# Patient Record
Sex: Female | Born: 1940 | Race: Black or African American | Hispanic: No | Smoking: Never smoker
Health system: Southern US, Community
[De-identification: ages and names within clinical notes are randomized; demographics above are authoritative.]

## PROBLEM LIST (undated history)

## (undated) DIAGNOSIS — N3281 Overactive bladder: Secondary | ICD-10-CM

## (undated) DIAGNOSIS — I10 Essential (primary) hypertension: Secondary | ICD-10-CM

## (undated) DIAGNOSIS — M1990 Unspecified osteoarthritis, unspecified site: Secondary | ICD-10-CM

## (undated) HISTORY — PX: BREAST SURGERY: SHX581

---

## 1998-05-14 ENCOUNTER — Other Ambulatory Visit: Admission: RE | Admit: 1998-05-14 | Discharge: 1998-05-14 | Payer: Self-pay | Admitting: Family Medicine

## 1998-07-17 ENCOUNTER — Ambulatory Visit (HOSPITAL_COMMUNITY): Admission: RE | Admit: 1998-07-17 | Discharge: 1998-07-17 | Payer: Self-pay | Admitting: Family Medicine

## 1998-07-17 ENCOUNTER — Encounter: Payer: Self-pay | Admitting: Family Medicine

## 2004-05-20 ENCOUNTER — Encounter: Admission: RE | Admit: 2004-05-20 | Discharge: 2004-05-20 | Payer: Self-pay | Admitting: General Surgery

## 2004-06-16 ENCOUNTER — Ambulatory Visit (HOSPITAL_COMMUNITY): Admission: RE | Admit: 2004-06-16 | Discharge: 2004-06-16 | Payer: Self-pay | Admitting: General Surgery

## 2004-06-16 ENCOUNTER — Encounter (INDEPENDENT_AMBULATORY_CARE_PROVIDER_SITE_OTHER): Payer: Self-pay | Admitting: Specialist

## 2004-06-24 ENCOUNTER — Ambulatory Visit: Payer: Self-pay | Admitting: Oncology

## 2004-07-07 ENCOUNTER — Ambulatory Visit: Admission: RE | Admit: 2004-07-07 | Discharge: 2004-07-07 | Payer: Self-pay | Admitting: Radiation Oncology

## 2004-07-08 ENCOUNTER — Ambulatory Visit: Admission: RE | Admit: 2004-07-08 | Discharge: 2004-09-04 | Payer: Self-pay | Admitting: Radiation Oncology

## 2004-08-31 ENCOUNTER — Ambulatory Visit: Payer: Self-pay | Admitting: Oncology

## 2004-09-23 ENCOUNTER — Encounter: Admission: RE | Admit: 2004-09-23 | Discharge: 2004-09-23 | Payer: Self-pay | Admitting: Oncology

## 2004-09-30 ENCOUNTER — Ambulatory Visit: Admission: RE | Admit: 2004-09-30 | Discharge: 2004-09-30 | Payer: Self-pay | Admitting: Radiation Oncology

## 2004-10-30 ENCOUNTER — Ambulatory Visit: Payer: Self-pay | Admitting: Oncology

## 2005-03-04 ENCOUNTER — Ambulatory Visit: Payer: Self-pay | Admitting: Oncology

## 2005-08-16 ENCOUNTER — Emergency Department (HOSPITAL_COMMUNITY): Admission: EM | Admit: 2005-08-16 | Discharge: 2005-08-16 | Payer: Self-pay | Admitting: Family Medicine

## 2005-08-26 ENCOUNTER — Ambulatory Visit: Payer: Self-pay | Admitting: Oncology

## 2006-03-02 ENCOUNTER — Ambulatory Visit: Payer: Self-pay | Admitting: Oncology

## 2006-03-11 LAB — CBC WITH DIFFERENTIAL/PLATELET
Eosinophils Absolute: 0.1 10*3/uL (ref 0.0–0.5)
HCT: 39.5 % (ref 34.8–46.6)
LYMPH%: 26.3 % (ref 14.0–48.0)
MCV: 82 fL (ref 81.0–101.0)
MONO#: 0.5 10*3/uL (ref 0.1–0.9)
MONO%: 11.9 % (ref 0.0–13.0)
NEUT#: 2.3 10*3/uL (ref 1.5–6.5)
NEUT%: 57.7 % (ref 39.6–76.8)
Platelets: 360 10*3/uL (ref 145–400)
WBC: 4 10*3/uL (ref 3.9–10.0)

## 2006-03-11 LAB — COMPREHENSIVE METABOLIC PANEL
BUN: 11 mg/dL (ref 6–23)
CO2: 30 mEq/L (ref 19–32)
Calcium: 9.4 mg/dL (ref 8.4–10.5)
Chloride: 104 mEq/L (ref 96–112)
Creatinine, Ser: 0.72 mg/dL (ref 0.40–1.20)
Glucose, Bld: 97 mg/dL (ref 70–99)
Total Bilirubin: 0.3 mg/dL (ref 0.3–1.2)

## 2006-09-06 ENCOUNTER — Ambulatory Visit: Payer: Self-pay | Admitting: Oncology

## 2006-09-09 LAB — CBC WITH DIFFERENTIAL/PLATELET
BASO%: 0.5 % (ref 0.0–2.0)
Basophils Absolute: 0 10*3/uL (ref 0.0–0.1)
Eosinophils Absolute: 0.1 10*3/uL (ref 0.0–0.5)
HCT: 40.1 % (ref 34.8–46.6)
HGB: 13.9 g/dL (ref 11.6–15.9)
MCHC: 34.7 g/dL (ref 32.0–36.0)
MONO#: 0.4 10*3/uL (ref 0.1–0.9)
NEUT#: 1.8 10*3/uL (ref 1.5–6.5)
NEUT%: 52.3 % (ref 39.6–76.8)
WBC: 3.4 10*3/uL — ABNORMAL LOW (ref 3.9–10.0)
lymph#: 1.1 10*3/uL (ref 0.9–3.3)

## 2006-09-09 LAB — COMPREHENSIVE METABOLIC PANEL
AST: 14 U/L (ref 0–37)
Alkaline Phosphatase: 70 U/L (ref 39–117)
BUN: 15 mg/dL (ref 6–23)
Glucose, Bld: 94 mg/dL (ref 70–99)
Potassium: 4 mEq/L (ref 3.5–5.3)
Sodium: 140 mEq/L (ref 135–145)
Total Bilirubin: 0.3 mg/dL (ref 0.3–1.2)
Total Protein: 7.6 g/dL (ref 6.0–8.3)

## 2006-09-26 ENCOUNTER — Encounter: Admission: RE | Admit: 2006-09-26 | Discharge: 2006-09-26 | Payer: Self-pay | Admitting: Oncology

## 2007-03-01 ENCOUNTER — Ambulatory Visit: Payer: Self-pay | Admitting: Oncology

## 2007-03-03 LAB — CBC WITH DIFFERENTIAL/PLATELET
Basophils Absolute: 0 10*3/uL (ref 0.0–0.1)
EOS%: 2.6 % (ref 0.0–7.0)
Eosinophils Absolute: 0.1 10*3/uL (ref 0.0–0.5)
HGB: 13.1 g/dL (ref 11.6–15.9)
LYMPH%: 37.8 % (ref 14.0–48.0)
MCH: 28.9 pg (ref 26.0–34.0)
MCV: 81.7 fL (ref 81.0–101.0)
MONO%: 11.1 % (ref 0.0–13.0)
NEUT#: 1.5 10*3/uL (ref 1.5–6.5)
Platelets: 305 10*3/uL (ref 145–400)

## 2007-03-03 LAB — COMPREHENSIVE METABOLIC PANEL
AST: 13 U/L (ref 0–37)
Alkaline Phosphatase: 65 U/L (ref 39–117)
BUN: 15 mg/dL (ref 6–23)
Creatinine, Ser: 0.72 mg/dL (ref 0.40–1.20)
Glucose, Bld: 111 mg/dL — ABNORMAL HIGH (ref 70–99)
Total Bilirubin: 0.3 mg/dL (ref 0.3–1.2)

## 2007-03-03 LAB — CANCER ANTIGEN 27.29: CA 27.29: 40 U/mL — ABNORMAL HIGH (ref 0–39)

## 2007-04-20 ENCOUNTER — Ambulatory Visit (HOSPITAL_COMMUNITY): Admission: RE | Admit: 2007-04-20 | Discharge: 2007-04-20 | Payer: Self-pay | Admitting: Oncology

## 2007-08-15 ENCOUNTER — Ambulatory Visit (HOSPITAL_COMMUNITY): Admission: RE | Admit: 2007-08-15 | Discharge: 2007-08-15 | Payer: Self-pay | Admitting: Family Medicine

## 2007-08-29 ENCOUNTER — Ambulatory Visit: Payer: Self-pay | Admitting: Oncology

## 2007-08-31 LAB — CBC WITH DIFFERENTIAL/PLATELET
BASO%: 0.7 % (ref 0.0–2.0)
HCT: 37.9 % (ref 34.8–46.6)
LYMPH%: 32.6 % (ref 14.0–48.0)
MCH: 28.3 pg (ref 26.0–34.0)
MCHC: 34.6 g/dL (ref 32.0–36.0)
MCV: 81.9 fL (ref 81.0–101.0)
MONO#: 0.4 10*3/uL (ref 0.1–0.9)
MONO%: 11.2 % (ref 0.0–13.0)
NEUT%: 51.9 % (ref 39.6–76.8)
Platelets: 344 10*3/uL (ref 145–400)
RBC: 4.63 10*6/uL (ref 3.70–5.32)

## 2007-08-31 LAB — COMPREHENSIVE METABOLIC PANEL
ALT: 10 U/L (ref 0–35)
Alkaline Phosphatase: 64 U/L (ref 39–117)
CO2: 25 mEq/L (ref 19–32)
Creatinine, Ser: 0.8 mg/dL (ref 0.40–1.20)
Glucose, Bld: 101 mg/dL — ABNORMAL HIGH (ref 70–99)
Sodium: 142 mEq/L (ref 135–145)
Total Bilirubin: 0.2 mg/dL — ABNORMAL LOW (ref 0.3–1.2)
Total Protein: 7.3 g/dL (ref 6.0–8.3)

## 2008-03-01 ENCOUNTER — Ambulatory Visit: Payer: Self-pay | Admitting: Oncology

## 2008-03-14 LAB — CBC WITH DIFFERENTIAL/PLATELET
BASO%: 1.7 % (ref 0.0–2.0)
HCT: 41.3 % (ref 34.8–46.6)
MCHC: 33.6 g/dL (ref 32.0–36.0)
MONO#: 0.3 10*3/uL (ref 0.1–0.9)
NEUT%: 43.1 % (ref 39.6–76.8)
WBC: 3.2 10*3/uL — ABNORMAL LOW (ref 3.9–10.0)
lymph#: 1.4 10*3/uL (ref 0.9–3.3)

## 2008-03-15 LAB — COMPREHENSIVE METABOLIC PANEL
ALT: 11 U/L (ref 0–35)
Albumin: 4.1 g/dL (ref 3.5–5.2)
CO2: 25 mEq/L (ref 19–32)
Calcium: 9.4 mg/dL (ref 8.4–10.5)
Chloride: 104 mEq/L (ref 96–112)
Creatinine, Ser: 1.15 mg/dL (ref 0.40–1.20)
Potassium: 4.4 mEq/L (ref 3.5–5.3)
Sodium: 142 mEq/L (ref 135–145)
Total Protein: 7.9 g/dL (ref 6.0–8.3)

## 2008-03-15 LAB — CANCER ANTIGEN 27.29: CA 27.29: 46 U/mL — ABNORMAL HIGH (ref 0–39)

## 2008-09-20 ENCOUNTER — Ambulatory Visit: Payer: Self-pay | Admitting: Oncology

## 2008-09-27 ENCOUNTER — Encounter: Admission: RE | Admit: 2008-09-27 | Discharge: 2008-09-27 | Payer: Self-pay | Admitting: Oncology

## 2008-10-01 LAB — CBC WITH DIFFERENTIAL/PLATELET
BASO%: 0.3 % (ref 0.0–2.0)
EOS%: 2.7 % (ref 0.0–7.0)
HCT: 40.8 % (ref 34.8–46.6)
LYMPH%: 43 % (ref 14.0–49.7)
MCH: 27.3 pg (ref 25.1–34.0)
MCHC: 33.6 g/dL (ref 31.5–36.0)
MCV: 81.4 fL (ref 79.5–101.0)
MONO%: 9.9 % (ref 0.0–14.0)
NEUT%: 44.1 % (ref 38.4–76.8)
Platelets: 304 10*3/uL (ref 145–400)

## 2008-10-02 LAB — COMPREHENSIVE METABOLIC PANEL
AST: 14 U/L (ref 0–37)
Alkaline Phosphatase: 72 U/L (ref 39–117)
BUN: 12 mg/dL (ref 6–23)
Calcium: 9.3 mg/dL (ref 8.4–10.5)
Chloride: 106 mEq/L (ref 96–112)
Creatinine, Ser: 0.83 mg/dL (ref 0.40–1.20)

## 2008-10-02 LAB — VITAMIN D 25 HYDROXY (VIT D DEFICIENCY, FRACTURES): Vit D, 25-Hydroxy: 28 ng/mL — ABNORMAL LOW (ref 30–89)

## 2008-11-01 ENCOUNTER — Ambulatory Visit (HOSPITAL_COMMUNITY): Admission: RE | Admit: 2008-11-01 | Discharge: 2008-11-01 | Payer: Self-pay | Admitting: Family Medicine

## 2009-03-31 ENCOUNTER — Ambulatory Visit: Payer: Self-pay | Admitting: Oncology

## 2009-04-02 LAB — COMPREHENSIVE METABOLIC PANEL
AST: 18 U/L (ref 0–37)
Albumin: 3.5 g/dL (ref 3.5–5.2)
Alkaline Phosphatase: 66 U/L (ref 39–117)
Potassium: 3.7 mEq/L (ref 3.5–5.3)
Sodium: 140 mEq/L (ref 135–145)
Total Protein: 7.5 g/dL (ref 6.0–8.3)

## 2009-04-02 LAB — CBC WITH DIFFERENTIAL/PLATELET
BASO%: 0.8 % (ref 0.0–2.0)
Basophils Absolute: 0 10*3/uL (ref 0.0–0.1)
EOS%: 2.9 % (ref 0.0–7.0)
HGB: 13.8 g/dL (ref 11.6–15.9)
MCH: 27.8 pg (ref 25.1–34.0)
MONO#: 0.4 10*3/uL (ref 0.1–0.9)
RDW: 13.9 % (ref 11.2–14.5)
WBC: 3.8 10*3/uL — ABNORMAL LOW (ref 3.9–10.3)
lymph#: 1.7 10*3/uL (ref 0.9–3.3)

## 2009-08-22 ENCOUNTER — Ambulatory Visit: Payer: Self-pay | Admitting: Oncology

## 2009-08-26 LAB — COMPREHENSIVE METABOLIC PANEL
ALT: 12 U/L (ref 0–35)
AST: 18 U/L (ref 0–37)
Alkaline Phosphatase: 66 U/L (ref 39–117)
BUN: 12 mg/dL (ref 6–23)
Chloride: 105 mEq/L (ref 96–112)
Creatinine, Ser: 0.77 mg/dL (ref 0.40–1.20)

## 2009-08-26 LAB — CBC WITH DIFFERENTIAL/PLATELET
BASO%: 0.7 % (ref 0.0–2.0)
Basophils Absolute: 0 10*3/uL (ref 0.0–0.1)
EOS%: 3.1 % (ref 0.0–7.0)
HGB: 13.9 g/dL (ref 11.6–15.9)
MCH: 27.8 pg (ref 25.1–34.0)
MCHC: 33.3 g/dL (ref 31.5–36.0)
MCV: 83.6 fL (ref 79.5–101.0)
MONO%: 10.5 % (ref 0.0–14.0)
NEUT%: 49.8 % (ref 38.4–76.8)
RDW: 13.3 % (ref 11.2–14.5)
lymph#: 1.5 10*3/uL (ref 0.9–3.3)

## 2009-08-26 LAB — VITAMIN D 25 HYDROXY (VIT D DEFICIENCY, FRACTURES): Vit D, 25-Hydroxy: 32 ng/mL (ref 30–89)

## 2010-08-27 ENCOUNTER — Other Ambulatory Visit: Payer: Self-pay | Admitting: Oncology

## 2010-08-27 ENCOUNTER — Encounter (HOSPITAL_BASED_OUTPATIENT_CLINIC_OR_DEPARTMENT_OTHER): Payer: Medicare Other | Admitting: Oncology

## 2010-08-27 DIAGNOSIS — D0590 Unspecified type of carcinoma in situ of unspecified breast: Secondary | ICD-10-CM

## 2010-08-27 LAB — CBC WITH DIFFERENTIAL/PLATELET
BASO%: 0.8 % (ref 0.0–2.0)
Basophils Absolute: 0 10*3/uL (ref 0.0–0.1)
Eosinophils Absolute: 0.2 10*3/uL (ref 0.0–0.5)
HCT: 39.6 % (ref 34.8–46.6)
HGB: 13.1 g/dL (ref 11.6–15.9)
LYMPH%: 33.2 % (ref 14.0–49.7)
MONO#: 0.5 10*3/uL (ref 0.1–0.9)
NEUT#: 1.8 10*3/uL (ref 1.5–6.5)
NEUT%: 49 % (ref 38.4–76.8)
Platelets: 352 10*3/uL (ref 145–400)
WBC: 3.8 10*3/uL — ABNORMAL LOW (ref 3.9–10.3)
lymph#: 1.3 10*3/uL (ref 0.9–3.3)

## 2010-08-28 LAB — COMPREHENSIVE METABOLIC PANEL
ALT: 10 U/L (ref 0–35)
BUN: 15 mg/dL (ref 6–23)
CO2: 23 mEq/L (ref 19–32)
Calcium: 8.9 mg/dL (ref 8.4–10.5)
Chloride: 105 mEq/L (ref 96–112)
Creatinine, Ser: 0.84 mg/dL (ref 0.40–1.20)
Glucose, Bld: 121 mg/dL — ABNORMAL HIGH (ref 70–99)

## 2010-08-28 LAB — VITAMIN D 25 HYDROXY (VIT D DEFICIENCY, FRACTURES): Vit D, 25-Hydroxy: 35 ng/mL (ref 30–89)

## 2010-09-03 ENCOUNTER — Encounter: Payer: Medicare Other | Admitting: Oncology

## 2010-10-30 NOTE — Op Note (Signed)
NAMEJAKEIRA, SEEMAN          ACCOUNT NO.:  [id number]   MEDICAL RECORD NO.:  [id number]          PATIENT TYPE:  OIB   LOCATION:  2899                         FACILITY:  [facility]   PHYSICIAN:  Rose Phi. Maple Hudson, M.D.   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   OPERATION:  Right partial mastectomy with needle localization and specimen  mammogram.   SURGEON:  Rose Phi. Maple Hudson, M.D.   ANESTHESIA:  General.   OPERATIVE PROCEDURE:  Prior to coming to the operating room, localizing  wires, three, had been placed in the right breast to localize the area of  DCIS visible on the mammogram.   After suitable general anesthesia was induced, the patient was placed in the  supine position with the arms extended on the arm board and the right breast  prepped and draped in the usual fashion.   Using the wires as a guide, a curved incision in the upper outer quadrant  was then made.  I exposed all the wires into the incision and then did a  wide excision of the wires and surrounding tissue and then oriented the  specimen for the pathologist.   Specimen mammogram confirmed the removal of the calcifications, and it was  thought to be close at the deep margin but we were on the pectoralis fascia  at that level.   With good hemostasis, I infiltrated all the incisions with 0.25% Marcaine.  We then closed in two layers of 3-0 Vicryl and subcuticular 4-0 Monocryl and  Steri-Strips.  Dressing applied.  Patient transferred to the recovery room  in satisfactory condition, having tolerated the procedure well.      Pete   PRY/MEDQ  D:  06/16/2004  T:  06/16/2004  Job:  [id number]

## 2013-10-26 ENCOUNTER — Telehealth: Payer: Self-pay | Admitting: Oncology

## 2013-10-26 NOTE — Telephone Encounter (Signed)
10/26/13 - Called patient on the phone for yearly follow-up to the NSABP B-35 study.  She states that she is doing good.  No problems.   She has not been in the hospital in the last year.  She has not had any fractures or broken bones and the only medicine she takes is calcium. She has not had a bone density test and has not had her mammogram this year.  I stressed to her to schedule a apt for her mammogram to be done. She sees Dr. Ronne BinningMcKenzie for her primary care needs.

## 2014-11-05 ENCOUNTER — Telehealth: Payer: Self-pay | Admitting: Oncology

## 2014-11-05 NOTE — Telephone Encounter (Signed)
11/05/14 - Spoke to patient on phone.  She states she is doing good.  No problems.  She has not been in the hospital, no broken bones or fractures. She received the letter with the info on which medicine she received while on the study.  I thanked the patient for her support in this clinical trial. Jasmine Vaughan 11/05/14 - 1:30 pm

## 2015-03-26 ENCOUNTER — Encounter (HOSPITAL_COMMUNITY): Payer: Self-pay | Admitting: Emergency Medicine

## 2015-03-26 ENCOUNTER — Emergency Department (INDEPENDENT_AMBULATORY_CARE_PROVIDER_SITE_OTHER)
Admission: EM | Admit: 2015-03-26 | Discharge: 2015-03-26 | Disposition: A | Discharge status: Home / Self Care | Payer: Medicare Other | Source: Home / Self Care | Attending: Family Medicine | Admitting: Family Medicine

## 2015-03-26 DIAGNOSIS — S46911A Strain of unspecified muscle, fascia and tendon at shoulder and upper arm level, right arm, initial encounter: Secondary | ICD-10-CM

## 2015-03-26 MED ORDER — MELOXICAM 15 MG PO TABS: 7.5000 mg | ORAL_TABLET | Freq: Every day | ORAL | Status: DC

## 2015-03-26 MED ORDER — METHOCARBAMOL 500 MG PO TABS: 500.0000 mg | ORAL_TABLET | Freq: Four times a day (QID) | ORAL | Status: DC | PRN

## 2015-03-26 NOTE — ED Provider Notes (Signed)
CSN: [id number]     Arrival date & time 03/26/15  1429 History   First MD Initiated Contact with Patient 03/26/15 1440     Chief Complaint  Patient presents with  . Shoulder Pain   (Consider location/radiation/quality/duration/timing/severity/associated sxs/prior Treatment) HPI  Right shoulder pain. Started 2 weeks ago. Gradual onset. No change in exercise, or trauma to the shoulder. Constant with waxing and waning nature. Hydrocodone from time to time without improvement. Biofreeze without improvement. No NSAID use. Radiation of pain from right shoulder to the proximal arm. Denies any loss of function or sensation in the arm.  History reviewed. No pertinent past medical history. Past Surgical History  Procedure Laterality Date  . Breast surgery     Family History  Problem Relation Age of Onset  . Family history unknown: Yes   Social History  Substance Use Topics  . Smoking status: Never Smoker   . Smokeless tobacco: None  . Alcohol Use: No   OB History    No data available     Review of Systems   Per HPI with all other pertinent systems negative.   Allergies  Review of patient's allergies indicates no known allergies.  Home Medications   Prior to Admission medications   Medication Sig Start Date End Date Taking? Authorizing Provider  acetaminophen (TYLENOL) 325 MG tablet Take 650 mg by mouth every 6 (six) hours as needed.   Yes Historical Provider, MD  HYDROcodone-acetaminophen (NORCO/VICODIN) 5-325 MG tablet Take 1 tablet by mouth every 6 (six) hours as needed for moderate pain.   Yes Historical Provider, MD  Menthol, Topical Analgesic, (BENGAY EX) Apply topically.   Yes Historical Provider, MD  Menthol, Topical Analgesic, (BIOFREEZE EX) Apply topically.   Yes Historical Provider, MD  meloxicam (MOBIC) 15 MG tablet Take 0.5-1 tablets (7.5-15 mg total) by mouth daily. 03/26/15   Ozella Rocksavid J Basilio Meadow, MD  methocarbamol (ROBAXIN) 500 MG tablet Take 1-2 tablets (500-1,000 mg  total) by mouth every 6 (six) hours as needed for muscle spasms. 03/26/15   Ozella Rocksavid J Miyako Oelke, MD   Meds Ordered and Administered this Visit  Medications - No data to display  BP 184/89 mmHg  Pulse 83  Temp(Src) 98.6 F (37 C) (Oral)  Resp 16  SpO2 100% No data found.   Physical Exam Physical Exam  Constitutional: oriented to person, place, and time. appears well-developed and well-nourished. No distress.  HENT:  Head: Normocephalic and atraumatic.  Eyes: EOMI. PERRL.  Neck: Normal range of motion.  Cardiovascular: RRR, no m/r/g, 2+ distal pulses,  Pulmonary/Chest: Effort normal and breath sounds normal. No respiratory distress.  Abdominal: Soft. Bowel sounds are normal. NonTTP, no distension.  Musculoskeletal: Right arm with limited range of motion with flexion greater than 90 and abduction greater than 45. Nontender to palpation. No effusions. Patient did not tolerate Hawkins, or cross body.  Neurological: alert and oriented to person, place, and time.  Skin: Skin is warm. No rash noted. non diaphoretic.  Psychiatric: normal mood and affect. behavior is normal. Judgment and thought content normal. ]  ED Course  Procedures (including critical care time)  Labs Review Labs Reviewed - No data to display  Imaging Review No results found.   Visual Acuity Review  Right Eye Distance:   Left Eye Distance:   Bilateral Distance:    Right Eye Near:   Left Eye Near:    Bilateral Near:         MDM   1. Shoulder strain, right, initial  encounter    Lengthy discussion had with regards to rehabilitation exercises and patient will start these. Patient will start meloxicam and Robaxin. Patient follow-up with primary care physician or sports medicine for further treatments such as physical therapy or possible steroid injection.    Ozella Rocks, MD 03/26/15 [phone]

## 2015-03-26 NOTE — ED Notes (Signed)
Right shoulder pain, unknown injury.  Shoulder pain for 3 weeks.  Radial pulse 2 plus.  Shoulder pain constant and increases with movement.  Reports unable to elevate arm above shoulder level.  denies fall.  Patient is right handed

## 2015-03-26 NOTE — Discharge Instructions (Signed)
You have developed shoulder strain causing irritation to the muscles and nerves of the shoulder and upper arm. Please start the exercises as discussed (15 repetitions each side (L&R), repeated 2-3 times per session for 2-3 sessions daily daily). Please start the meloxicam for pain and inflammation. This may upset your stomach to make sure that you take it with food and consider taking an antacid. These use the Robaxin for additional muscle strain relief. This medicine may make you sleepy. If your symptoms do not improve you may need a steroid injection into the shoulder or formal physical therapy. Physical therapy will have to be ordered by her primary care doctor or by a sports medicine specialist.

## 2015-04-07 ENCOUNTER — Other Ambulatory Visit: Payer: Self-pay | Admitting: Internal Medicine

## 2017-07-31 ENCOUNTER — Other Ambulatory Visit: Payer: Self-pay

## 2017-07-31 ENCOUNTER — Ambulatory Visit (HOSPITAL_COMMUNITY)
Admission: EM | Admit: 2017-07-31 | Discharge: 2017-07-31 | Disposition: A | Discharge status: Home / Self Care | Payer: Medicare Other | Attending: Physician Assistant | Admitting: Physician Assistant

## 2017-07-31 ENCOUNTER — Encounter (HOSPITAL_COMMUNITY): Payer: Self-pay | Admitting: *Deleted

## 2017-07-31 DIAGNOSIS — R030 Elevated blood-pressure reading, without diagnosis of hypertension: Secondary | ICD-10-CM

## 2017-07-31 DIAGNOSIS — R21 Rash and other nonspecific skin eruption: Secondary | ICD-10-CM

## 2017-07-31 MED ORDER — TRIAMCINOLONE ACETONIDE 0.1 % EX CREA: 1.0000 "application " | TOPICAL_CREAM | Freq: Two times a day (BID) | CUTANEOUS | 0 refills | Status: DC

## 2017-07-31 NOTE — ED Triage Notes (Signed)
States saw a "beige looking spider" when shaking out her blanket 1.5 wks ago; 2 days ago noticed itching to spot on left back.  Lesion noted with small surrounding area of bumpy rash.  C/O HA and "pain in sides when walking".

## 2017-07-31 NOTE — ED Provider Notes (Signed)
07/31/2017 12:57 PM   DOB: 02-12-1941 / MRN: [id number]  SUBJECTIVE:  Jasmine Vaughan is a 77 y.o. female presenting for rash in the posterior left upper thorax that started about a week ago and she complains mostly of itching.  She thinks this was a spider bite.  She has 2 other  areas that have been itching her on the left side about the lateral chest and also has rash on her right hip.  She has tried several creams.  She does not have a history of hypertension.  She has a primary provider  She has No Known Allergies.   She  has no past medical history on file.    She  reports that  has never smoked. she has never used smokeless tobacco. She reports that she does not drink alcohol or use drugs. She  has no sexual activity history on file. The patient  has a past surgical history that includes Breast surgery.  Her Family history is unknown by patient.  Review of Systems  Constitutional: Negative for fever.  Respiratory: Negative for shortness of breath.   Cardiovascular: Negative for chest pain and leg swelling.  Gastrointestinal: Negative for nausea.  Skin: Positive for itching and rash.  Neurological: Negative for dizziness.    OBJECTIVE:  BP (!) 166/92   Pulse 95   Temp 99.5 F (37.5 C) (Oral)   Resp 18   SpO2 96%   BP Readings from Last 3 Encounters:  07/31/17 (!) 166/92  03/26/15 184/89   Physical Exam  Constitutional: She is active.  Non-toxic appearance.  Cardiovascular: Normal rate, regular rhythm, S1 normal, S2 normal, normal heart sounds and intact distal pulses. Exam reveals no gallop, no friction rub and no decreased pulses.  No murmur heard. Pulmonary/Chest: Effort normal. No stridor. No tachypnea. No respiratory distress. She has no wheezes. She has no rales.  Abdominal: She exhibits no distension.  Musculoskeletal: She exhibits no edema.  Neurological: She is alert.  Skin: Skin is warm and dry. Rash noted. She is not diaphoretic. No pallor.       No  results found for this or any previous visit (from the past 72 hour(s)).  No results found.  ASSESSMENT AND PLAN:  No orders of the defined types were placed in this encounter.    Rash and nonspecific skin eruption - If the rash were not bilateral I would have diagnosed shingles.  However this would not change much as the rash has been present now for about a week.  Given the incessant itching it may be bug bites.  She denies any new residence in her home.  We will try some triamcinolone and she will see her primary care provider if the problem persists.  Elevated blood pressure reading - 148/72 by auscultation on recheck.      The patient is advised to call or return to clinic if she does not see an improvement in symptoms, or to seek the care of the closest emergency department if she worsens with the above plan.   Deliah BostonMichael Clark, MHS, PA-C 07/31/2017 12:57 PM    Ofilia Neaslark, Michael L, PA-C 07/31/17 1257

## 2017-07-31 NOTE — Discharge Instructions (Signed)
See your primary care provider in the next week or 2 if the problem persist.  I sent some cream to your pharmacy that should help with itching and make the rash decrease.

## 2017-08-19 DIAGNOSIS — I517 Cardiomegaly: Secondary | ICD-10-CM | POA: Diagnosis not present

## 2017-08-19 DIAGNOSIS — I10 Essential (primary) hypertension: Secondary | ICD-10-CM | POA: Diagnosis not present

## 2017-08-19 DIAGNOSIS — R9431 Abnormal electrocardiogram [ECG] [EKG]: Secondary | ICD-10-CM | POA: Diagnosis not present

## 2017-09-06 DIAGNOSIS — I10 Essential (primary) hypertension: Secondary | ICD-10-CM | POA: Diagnosis not present

## 2017-09-06 DIAGNOSIS — E785 Hyperlipidemia, unspecified: Secondary | ICD-10-CM | POA: Diagnosis not present

## 2017-09-06 DIAGNOSIS — I119 Hypertensive heart disease without heart failure: Secondary | ICD-10-CM | POA: Diagnosis not present

## 2017-09-06 DIAGNOSIS — R7303 Prediabetes: Secondary | ICD-10-CM | POA: Diagnosis not present

## 2017-10-03 DIAGNOSIS — Z1211 Encounter for screening for malignant neoplasm of colon: Secondary | ICD-10-CM | POA: Diagnosis not present

## 2017-10-03 DIAGNOSIS — E782 Mixed hyperlipidemia: Secondary | ICD-10-CM | POA: Diagnosis not present

## 2017-10-03 DIAGNOSIS — I10 Essential (primary) hypertension: Secondary | ICD-10-CM | POA: Diagnosis not present

## 2017-10-18 DIAGNOSIS — K5730 Diverticulosis of large intestine without perforation or abscess without bleeding: Secondary | ICD-10-CM | POA: Diagnosis not present

## 2017-10-18 DIAGNOSIS — Z1211 Encounter for screening for malignant neoplasm of colon: Secondary | ICD-10-CM | POA: Diagnosis not present

## 2017-11-15 DIAGNOSIS — I119 Hypertensive heart disease without heart failure: Secondary | ICD-10-CM | POA: Diagnosis not present

## 2017-11-15 DIAGNOSIS — E785 Hyperlipidemia, unspecified: Secondary | ICD-10-CM | POA: Diagnosis not present

## 2017-11-15 DIAGNOSIS — I10 Essential (primary) hypertension: Secondary | ICD-10-CM | POA: Diagnosis not present

## 2017-11-15 DIAGNOSIS — R51 Headache: Secondary | ICD-10-CM | POA: Diagnosis not present

## 2017-11-15 DIAGNOSIS — Z0000 Encounter for general adult medical examination without abnormal findings: Secondary | ICD-10-CM | POA: Diagnosis not present

## 2017-11-15 DIAGNOSIS — R7303 Prediabetes: Secondary | ICD-10-CM | POA: Diagnosis not present

## 2017-11-21 DIAGNOSIS — I10 Essential (primary) hypertension: Secondary | ICD-10-CM | POA: Diagnosis not present

## 2017-11-21 DIAGNOSIS — E785 Hyperlipidemia, unspecified: Secondary | ICD-10-CM | POA: Diagnosis not present

## 2017-11-21 DIAGNOSIS — M542 Cervicalgia: Secondary | ICD-10-CM | POA: Diagnosis not present

## 2017-12-01 DIAGNOSIS — R7303 Prediabetes: Secondary | ICD-10-CM | POA: Diagnosis not present

## 2017-12-01 DIAGNOSIS — I119 Hypertensive heart disease without heart failure: Secondary | ICD-10-CM | POA: Diagnosis not present

## 2017-12-01 DIAGNOSIS — M542 Cervicalgia: Secondary | ICD-10-CM | POA: Diagnosis not present

## 2017-12-01 DIAGNOSIS — E785 Hyperlipidemia, unspecified: Secondary | ICD-10-CM | POA: Diagnosis not present

## 2017-12-01 DIAGNOSIS — I10 Essential (primary) hypertension: Secondary | ICD-10-CM | POA: Diagnosis not present

## 2017-12-12 DIAGNOSIS — I10 Essential (primary) hypertension: Secondary | ICD-10-CM | POA: Diagnosis not present

## 2017-12-12 DIAGNOSIS — M542 Cervicalgia: Secondary | ICD-10-CM | POA: Diagnosis not present

## 2017-12-12 DIAGNOSIS — E785 Hyperlipidemia, unspecified: Secondary | ICD-10-CM | POA: Diagnosis not present

## 2017-12-12 DIAGNOSIS — R7303 Prediabetes: Secondary | ICD-10-CM | POA: Diagnosis not present

## 2017-12-12 DIAGNOSIS — I119 Hypertensive heart disease without heart failure: Secondary | ICD-10-CM | POA: Diagnosis not present

## 2017-12-13 DIAGNOSIS — M5412 Radiculopathy, cervical region: Secondary | ICD-10-CM | POA: Diagnosis not present

## 2017-12-22 DIAGNOSIS — M5412 Radiculopathy, cervical region: Secondary | ICD-10-CM | POA: Diagnosis not present

## 2018-01-09 ENCOUNTER — Other Ambulatory Visit: Payer: Self-pay

## 2018-01-09 ENCOUNTER — Ambulatory Visit: Payer: Medicare Other | Attending: Orthopedic Surgery | Admitting: Physical Therapy

## 2018-01-09 ENCOUNTER — Encounter: Payer: Self-pay | Admitting: Physical Therapy

## 2018-01-09 DIAGNOSIS — M62838 Other muscle spasm: Secondary | ICD-10-CM | POA: Diagnosis not present

## 2018-01-09 DIAGNOSIS — M542 Cervicalgia: Secondary | ICD-10-CM | POA: Insufficient documentation

## 2018-01-09 NOTE — Therapy (Signed)
[facility] [address] [place], [place], [address] Phone: [phone]   Fax:  [phone]  Physical Therapy Evaluation  Patient Details  Name: Jasmine Vaughan MRN: [id number] Date of Birth: 05-Aug-1940 Referring Provider: Dr Lunette Stands    Encounter Date: 01/09/2018  PT End of Session - 01/09/18 1323    Visit Number  1    Number of Visits  12    Date for PT Re-Evaluation  02/20/18    PT Start Time  1148    PT Stop Time  1229    PT Time Calculation (min)  41 min    Activity Tolerance  Patient tolerated treatment well    Behavior During Therapy  [facility] for tasks assessed/performed       History reviewed. No pertinent past medical history.  Past Surgical History:  Procedure Laterality Date  . BREAST SURGERY      There were no vitals filed for this visit.   Subjective Assessment - 01/09/18 1151    Subjective  Patient began having neck and shoulder blade pain backj in March. In February she was bittent but a spider on the left side of the back. she feels like that pain went to the right shoulder.     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  Yes    Pain Score  5     Pain Location  Neck    Pain Orientation  Right    Pain Descriptors / Indicators  Aching    Pain Type  Chronic pain    Pain Radiating Towards  has had some pain down into the hand but feels it mayhave came from hitting her elbow     Pain Onset  More than a month ago    Aggravating Factors   turning her head     Pain Relieving Factors  massage     Effect of Pain on Daily Activities  difficulty turning her head to complete daily tasks          [facility] PT Assessment - 01/09/18 0001      Assessment   Medical Diagnosis  Right Cervical Spine Pain     Referring Provider  Dr Lunette Stands     Hand Dominance  Right    Next MD Visit  Thursday     Prior  Therapy  None       Precautions   Precautions  None      Restrictions   Weight Bearing Restrictions  No      Coordination   Gross Motor Movements are Fluid and Coordinated  Yes    Fine Motor Movements are Fluid and Coordinated  Yes      Posture/Postural Control   Posture Comments  rounded shoulders       ROM / Strength   AROM / PROM / Strength  PROM;AROM;Strength      AROM   Overall AROM Comments  full active flexion of the shoulder but pain at end range     AROM Assessment Site  Cervical    Cervical Flexion  20    Cervical Extension  14    Cervical - Right Side Bend  pain     Cervical - Left Side Bend  no pain     Cervical - Right Rotation  45    Cervical -  Left Rotation  65      Strength   Strength Assessment Site  Shoulder    Right/Left Shoulder  Right;Left    Right Shoulder Flexion  5/5    Right Shoulder ABduction  4+/5    Right Shoulder Internal Rotation  5/5    Right Shoulder External Rotation  4+/5    Left Shoulder Flexion  4/5    Left Shoulder Internal Rotation  4+/5    Left Shoulder External Rotation  4/5      Palpation   Palpation comment  PSamingin into left cervical paraspinals and left upper trap       Special Tests   Other special tests  Cervicalgia.                 Objective measurements completed on examination: See above findings.      [facility] Adult PT Treatment/Exercise - 01/09/18 0001      Manual Therapy   Manual Therapy  Soft tissue mobilization;Manual Traction    Soft tissue mobilization  to bilateral upper traps     Manual Traction  gentle to cervical spine; sub occipital release       Neck Exercises: Stretches   Upper Trapezius Stretch  3 reps;20 seconds;Right    Levator Stretch  30 seconds;2 reps;Right             PT Education - 01/09/18 1320    Education Details  reviewed HEP and symptom management     Person(s) Educated  Patient    Methods  Explanation;Demonstration;Tactile cues;Handout;Verbal cues     Comprehension  Verbalized understanding;Returned demonstration;Verbal cues required;Tactile cues required       PT Short Term Goals - 01/09/18 1508      PT SHORT TERM GOAL #1   Title  Patient will increase neck extension by 10 degrees    Time  3    Period  Weeks    Status  New    Target Date  02/06/18      PT SHORT TERM GOAL #2   Title  Patient will increase right cervical rotation by 20 degrees     Time  3    Period  Weeks    Status  New    Target Date  01/30/18      PT SHORT TERM GOAL #3   Title  {Patient will increase right shoulder flexion to 4+/5     Time  3    Period  Weeks    Target Date  01/30/18        PT Long Term Goals - 01/09/18 1311      PT LONG TERM GOAL #1   Title  Patient will demosntrate normal pain free neck motion into right rotation and cervical extension    Time  4    Period  Weeks    Status  New    Target Date  02/06/18      PT LONG TERM GOAL #2   Title  Patient will demsotrate a 40% limiation on FOTO to demonstrate improved function     Time  4    Period  Weeks    Status  New    Target Date  02/06/18             Plan - 01/09/18 1323    Clinical Impression Statement  Patient is a 77 year old female who presents with right sided cervical pain. She presents with significant spasming in his right upper trap and right cervical  spine. She has decreased right upper extremity strength. She has limited right cervical flexion and limited cervical extension. 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 She would benefit from skilled therapy to decrease pain and spasming and to increase movement of the cervical spine     History and Personal Factors relevant to plan of care:  nothing     Clinical Presentation  Evolving    Clinical Decision Making  Low    Rehab Potential  Good    PT Frequency  2x / week    PT Duration  6 weeks    PT Treatment/Interventions  ADLs/Self Care Home Management;Cryotherapy;Electrical  Stimulation;Iontophoresis 4mg /ml Dexamethasone;Traction;Moist Heat;Ultrasound;DME Instruction;Therapeutic activities;Therapeutic exercise;Neuromuscular re-education;Patient/family education;Manual techniques;Passive range of motion;Splinting;Taping;Dry needling    PT Next Visit Plan  continue soft tisue mobilization, manual traction, add scap reatraction; chin tucks; consder supineER and abduction; modalities PRN. [facility] but is uneasy about trying [facility].     PT Home Exercise Plan  upper trap stretch, levator stretch; self soft tissue mobilization     Consulted and Agree with Plan of Care  Patient       Patient will benefit from skilled therapeutic intervention in order to improve the following deficits and impairments:  Pain, Increased fascial restricitons, Impaired tone, Increased muscle spasms, Decreased activity tolerance, Decreased endurance, Decreased strength, Decreased range of motion  Visit Diagnosis: Cervicalgia - Plan: PT plan of care cert/re-cert  Other muscle spasm - Plan: PT plan of care cert/re-cert     Problem List There are no active problems to display for this patient.   Dessie Comaavid J Kataleena Holsapple PT DPT  01/09/2018, 3:17 PM  [facility] [address] [place], [place], [address] Phone: [phone]   Fax:  [phone]  Name: Jasmine Vaughan MRN: [id number] Date of Birth: 02-Jun-1941

## 2018-01-11 ENCOUNTER — Encounter: Payer: Self-pay | Admitting: Physical Therapy

## 2018-01-11 ENCOUNTER — Ambulatory Visit: Payer: Medicare Other | Admitting: Physical Therapy

## 2018-01-11 DIAGNOSIS — M542 Cervicalgia: Secondary | ICD-10-CM | POA: Diagnosis not present

## 2018-01-11 DIAGNOSIS — M62838 Other muscle spasm: Secondary | ICD-10-CM

## 2018-01-11 NOTE — Patient Instructions (Signed)
Head Press With Chin Tuck    Tuck chin SLIGHTLY toward chest, keep mouth closed. Feel weight on back of head. Increase weight by pressing head down. Hold __5_ seconds. Relax. Repeat _10-20__ times. Surface: floor or bed   Scapular Retraction (Standing)   With arms at sides, pinch shoulder blades together. Repeat _10___ times per set. Do _2___ sets per session. Do _2___ sessions per day.

## 2018-01-11 NOTE — Therapy (Signed)
[facility] [address] [place], [place], [address] Phone: [phone]   Fax:  [phone]  Physical Therapy Treatment  Patient Details  Name: Jasmine Vaughan MRN: [id number] Date of Birth: [birth year] Referring Provider: Dr Lunette Stands    Encounter Date: 01/11/2018  PT End of Session - 01/11/18 1039    Visit Number  2    Number of Visits  12    Date for PT Re-Evaluation  02/20/18    PT Start Time  1015    PT Stop Time  1103    PT Time Calculation (min)  48 min       History reviewed. No pertinent past medical history.  Past Surgical History:  Procedure Laterality Date  . BREAST SURGERY      There were no vitals filed for this visit.  Subjective Assessment - 01/11/18 1021    Subjective  Early this morning I had pain in my right upper trap and side of neck.    Currently in Pain?  No/denies                       [facility] Adult PT Treatment/Exercise - 01/11/18 0001      Neck Exercises: Seated   Other Seated Exercise  scap retract seated and standing       Neck Exercises: Supine   Neck Retraction  10 reps      Modalities   Modalities  Moist Heat      Moist Heat Therapy   Number Minutes Moist Heat  10 Minutes    Moist Heat Location  Cervical      Manual Therapy   Soft tissue mobilization  to right upper trap , paraspinals     Manual Traction  gentle to cervical spine; sub occipital release       Neck Exercises: Stretches   Upper Trapezius Stretch  3 reps;20 seconds;Right    Levator Stretch  30 seconds;2 reps;Right             PT Education - 01/11/18 1056    Education Details  HEP    Person(s) Educated  Patient    Methods  Explanation;Handout    Comprehension  Verbalized understanding       PT Short Term Goals - 01/09/18 1508      PT SHORT TERM GOAL #1   Title  Patient will increase neck extension by 10 degrees    Time  3    Period  Weeks    Status  New    Target Date   02/06/18      PT SHORT TERM GOAL #2   Title  Patient will increase right cervical rotation by 20 degrees     Time  3    Period  Weeks    Status  New    Target Date  01/30/18      PT SHORT TERM GOAL #3   Title  {Patient will increase right shoulder flexion to 4+/5     Time  3    Period  Weeks    Target Date  01/30/18        PT Long Term Goals - 01/09/18 1311      PT LONG TERM GOAL #1   Title  Patient will demosntrate normal pain free neck motion into right rotation and cervical extension    Time  4    Period  Weeks    Status  New    Target Date  02/06/18      PT LONG TERM GOAL #2   Title  Patient will demsotrate a 40% limiation on FOTO to demonstrate improved function     Time  4    Period  Weeks    Status  New    Target Date  02/06/18            Plan - 01/11/18 1057    Clinical Impression Statement  Reviewed neck stretches and progressed with postural correction exercises. No pain at beginning of session. Some pain with levator stretch. Time focused on manual to right neck musculature. HMP at end of session. She reports decreased pain with cervical distraction.     PT Next Visit Plan  continue soft tisue mobilization, manual traction, add scap reatraction; chin tucks; consder supineER and abduction; modalities PRN. [facility] but is uneasy about trying it.     PT Home Exercise Plan  upper trap stretch, levator stretch; self soft tissue mobilization , scap squeeze, supine chin tuck    Consulted and Agree with Plan of Care  Patient       Patient will benefit from skilled therapeutic intervention in order to improve the following deficits and impairments:  Pain, Increased fascial restricitons, Impaired tone, Increased muscle spasms, Decreased activity tolerance, Decreased endurance, Decreased strength, Decreased range of motion  Visit Diagnosis: Cervicalgia  Other muscle spasm     Problem List There are no active problems to display for this  patient.   Sherrie MustacheDonoho, Hermelinda Diegel McGee, VirginiaPTA 01/11/2018, 11:44 AM  [facility] [address] [place], [place], [address] Phone: [phone]   Fax:  [phone]  Name: Jasmine DieterBarbara Calk MRN: [id number] Date of Birth: [birth year]

## 2018-01-12 DIAGNOSIS — M5412 Radiculopathy, cervical region: Secondary | ICD-10-CM | POA: Diagnosis not present

## 2018-01-23 DIAGNOSIS — M542 Cervicalgia: Secondary | ICD-10-CM | POA: Diagnosis not present

## 2018-01-23 DIAGNOSIS — Z5181 Encounter for therapeutic drug level monitoring: Secondary | ICD-10-CM | POA: Diagnosis not present

## 2018-01-23 DIAGNOSIS — I119 Hypertensive heart disease without heart failure: Secondary | ICD-10-CM | POA: Diagnosis not present

## 2018-01-23 DIAGNOSIS — I10 Essential (primary) hypertension: Secondary | ICD-10-CM | POA: Diagnosis not present

## 2018-01-24 ENCOUNTER — Ambulatory Visit: Payer: Medicare Other | Attending: Orthopedic Surgery | Admitting: Physical Therapy

## 2018-01-24 ENCOUNTER — Encounter: Payer: Self-pay | Admitting: Physical Therapy

## 2018-01-24 DIAGNOSIS — M62838 Other muscle spasm: Secondary | ICD-10-CM | POA: Diagnosis not present

## 2018-01-24 DIAGNOSIS — M542 Cervicalgia: Secondary | ICD-10-CM | POA: Insufficient documentation

## 2018-01-24 NOTE — Patient Instructions (Signed)
Over Head Pull: Narrow Grip       On back, knees bent, feet flat, band across thighs, elbows straight but relaxed. Pull hands apart (start). Keeping elbows straight, bring arms up and over head, hands toward floor. Keep pull steady on band. Hold momentarily. Return slowly, keeping pull steady, back to start. Repeat __5-10_ times. Band color __Y____   Side Pull: Double Arm   On back, knees bent, feet flat. Arms perpendicular to body, shoulder level, elbows straight but relaxed. Pull arms out to sides, elbows straight. Resistance band comes across collarbones, hands toward floor. Hold momentarily. Slowly return to starting position. Repeat _5-10__ times. Band color _Y____   Sash   On back, knees bent, feet flat, left hand on left hip, right hand above left. Pull right arm DIAGONALLY (hip to shoulder) across chest. Bring right arm along head toward floor. Hold momentarily. Slowly return to starting position. Repeat __5-10_ times. Do with left arm. Band color __Y____   Shoulder Rotation: Double Arm   On back, knees bent, feet flat, elbows tucked at sides, bent 90, hands palms up. Pull hands apart and down toward floor, keeping elbows near sides. Hold momentarily. Slowly return to starting position. Repeat _5-10__ times. Band color __Y____

## 2018-01-24 NOTE — Therapy (Signed)
[name], [place], [address] Phone: [phone]   Fax:  [phone]  Physical Therapy Treatment  Patient Details  Name: Jasmine Vaughan MRN: [id number] Date of Birth: [birth year] Referring Provider: Dr Almedia Balls    Encounter Date: 01/24/2018  PT End of Session - 01/24/18 1031    Visit Number  3    Number of Visits  12    Date for PT Re-Evaluation  02/20/18    PT Start Time  4825    PT Stop Time  1110    PT Time Calculation (min)  55 min       History reviewed. No pertinent past medical history.  Past Surgical History:  Procedure Laterality Date  . BREAST SURGERY      There were no vitals filed for this visit.  Subjective Assessment - 01/24/18 1021    Subjective  Went to MD yesterday and she was pleased. Overall I feel 75% decrease in neck pain.     Currently in Pain?  No/denies    Aggravating Factors   reaching behind head and neck to wash     Pain Relieving Factors  rest          [facility] PT Assessment - 01/24/18 0001      AROM   Cervical Flexion  40    Cervical Extension  40    Cervical - Right Rotation  60    Cervical - Left Rotation  50      Strength   Right Shoulder Flexion  4/5                   [facility] Adult PT Treatment/Exercise - 01/24/18 0001      Neck Exercises: Supine   Neck Retraction  10 reps    Other Supine Exercise  supine scap stab yelllow 5 reps x 2 each       Moist Heat Therapy   Number Minutes Moist Heat  15 Minutes    Moist Heat Location  Cervical      Manual Therapy   Soft tissue mobilization  to right upper trap , paraspinals     Manual Traction  gentle to cervical spine; sub occipital release              PT Education - 01/24/18 1044    Education Details  HEP     Person(s) Educated  Patient    Methods  Explanation;Handout    Comprehension  Verbalized understanding       PT Short Term Goals - 01/24/18 1031      PT SHORT TERM  GOAL #1   Title  Patient will increase neck extension by 10 degrees    Time  3    Period  Weeks    Status  Achieved      PT SHORT TERM GOAL #2   Title  Patient will increase right cervical rotation by 20 degrees     Baseline  15 degree improvement     Time  3    Period  Weeks    Status  Partially Met      PT SHORT TERM GOAL #3   Title  {Patient will increase right shoulder flexion to 4+/5     Baseline  4/5 right shoulder flexion    Time  3    Period  Weeks    Status  On-going        PT Long Term Goals - 01/24/18 0037  PT LONG TERM GOAL #1   Title  Patient will demosntrate normal pain free neck motion into right rotation and cervical extension    Time  4    Period  Weeks      PT LONG TERM GOAL #2   Title  Patient will demsotrate a 40% limiation on FOTO to demonstrate improved function     Time  4    Period  Weeks    Status  Unable to assess            Plan - 01/24/18 1024    Clinical Impression Statement  No longer pain with cervical rotation. Palpation much less tender to right side of neck/ upper trap. Pt feels pain has moved to her lumbar spine. AROM improved in extension, flexion and right cervical rotation. STG# 1 met, STG# 2 partially met. No change in right shoulder flexion strength. Began scapular stabilization exercises and updated HEP. Some increased pain with multiple reps. Cautioned to stop when painful.     PT Next Visit Plan  continue soft tisue mobilization, manual traction, add scap reatraction; chin tucks; review supine scap stab; modalities PRN. Would benefit from [facility] but is uneasy about trying it.     PT Home Exercise Plan  upper trap stretch, levator stretch; self soft tissue mobilization , scap squeeze, supine chin tuck, supine scap stab yellow band     Consulted and Agree with Plan of Care  Patient       Patient will benefit from skilled therapeutic intervention in order to improve the following deficits and impairments:  Pain, Increased  fascial restricitons, Impaired tone, Increased muscle spasms, Decreased activity tolerance, Decreased endurance, Decreased strength, Decreased range of motion  Visit Diagnosis: Cervicalgia  Other muscle spasm     Problem List There are no active problems to display for this patient.   Jasmine Vaughan, [place] 01/24/2018, 11:03 AM  [facility] [address] [place], [place], [address] Phone: [phone]   Fax:  [phone]  Name: Jasmine Vaughan MRN: [id number] Date of Birth: 22-Nov-1940

## 2018-01-30 ENCOUNTER — Ambulatory Visit: Payer: Medicare Other | Admitting: Physical Therapy

## 2018-01-30 ENCOUNTER — Encounter: Payer: Self-pay | Admitting: Physical Therapy

## 2018-01-30 DIAGNOSIS — M62838 Other muscle spasm: Secondary | ICD-10-CM | POA: Diagnosis not present

## 2018-01-30 DIAGNOSIS — M542 Cervicalgia: Secondary | ICD-10-CM

## 2018-01-31 ENCOUNTER — Encounter: Payer: Self-pay | Admitting: Physical Therapy

## 2018-01-31 NOTE — Therapy (Signed)
[address] [place] [address], [place], [address] Phone: [phone]   Fax:  [phone]  Physical Therapy Treatment  Patient Details  Name: Jasmine Vaughan MRN: [id number] Date of Birth: 1940-08-26 Referring Provider: Dr Almedia Balls    Encounter Date: 01/30/2018  PT End of Session - 01/30/18 1440    Visit Number  4    Number of Visits  12    Date for PT Re-Evaluation  02/20/18    PT Start Time  0623    PT Stop Time  1456    PT Time Calculation (min)  41 min    Activity Tolerance  Patient tolerated treatment well    Behavior During Therapy  [facility] for tasks assessed/performed       History reviewed. No pertinent past medical history.  Past Surgical History:  Procedure Laterality Date  . BREAST SURGERY      There were no vitals filed for this visit.  Subjective Assessment - 01/30/18 1420    Subjective  Patient reports her pain has been better. She has still had alittle pain early in the morning. She is not having pain right now.     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  No/denies                       [facility] Adult PT Treatment/Exercise - 01/31/18 0001      Neck Exercises: Supine   Neck Retraction  10 reps    Other Supine Exercise  supine scap stab yelllow 5 reps x 2 each       Shoulder Exercises: Standing   Extension Limitations  yellow 2x7     Row Limitations  yellow 2x7       Moist Heat Therapy   Moist Heat Location  Cervical      Manual Therapy   Soft tissue mobilization  to right upper trap , paraspinals; IASTYM     Manual Traction  gentle to cervical spine; sub occipital release              PT Education - 01/30/18 1440    Education Details  reviewed HEP; importance of posture.     Person(s) Educated  Patient    Methods  Demonstration;Explanation;Tactile  cues;Verbal cues    Comprehension  Verbalized understanding;Returned demonstration;Verbal cues required;Tactile cues required;Need further instruction       PT Short Term Goals - 01/24/18 1031      PT SHORT TERM GOAL #1   Title  Patient will increase neck extension by 10 degrees    Time  3    Period  Weeks    Status  Achieved      PT SHORT TERM GOAL #2   Title  Patient will increase right cervical rotation by 20 degrees     Baseline  15 degree improvement     Time  3    Period  Weeks    Status  Partially Met      PT SHORT TERM GOAL #3   Title  {Patient will increase right shoulder flexion to 4+/5     Baseline  4/5 right shoulder flexion    Time  3    Period  Weeks    Status  On-going        PT Long Term  Goals - 01/24/18 1033      PT LONG TERM GOAL #1   Title  Patient will demosntrate normal pain free neck motion into right rotation and cervical extension    Time  4    Period  Weeks      PT LONG TERM GOAL #2   Title  Patient will demsotrate a 40% limiation on FOTO to demonstrate improved function     Time  4    Period  Weeks    Status  Unable to assess            Plan - 01/31/18 0814    Clinical Impression Statement  Patient is makinggood progress. she continues to have some pspasming on the right but it has improved signifcantly. She was given light postrual execises to work on at home. She had no incease in pain.  Per visual inpection her rotation has improved significantly.     Clinical Presentation  Evolving    Clinical Decision Making  Low    Rehab Potential  Good    PT Frequency  2x / week    PT Duration  6 weeks    PT Treatment/Interventions  ADLs/Self Care Home Management;Cryotherapy;Electrical Stimulation;Iontophoresis 4m/ml Dexamethasone;Traction;Moist Heat;Ultrasound;DME Instruction;Therapeutic activities;Therapeutic exercise;Neuromuscular re-education;Patient/family education;Manual techniques;Passive range of motion;Splinting;Taping;Dry needling     PT Next Visit Plan  continue soft tisue mobilization, manual traction, add scap reatraction; chin tucks; review supine scap stab; modalities PRN. [facility]     PT Home Exercise Plan  upper trap stretch, levator stretch; self soft tissue mobilization , scap squeeze, supine chin tuck, supine scap stab yellow band     Consulted and Agree with Plan of Care  Patient       Patient will benefit from skilled therapeutic intervention in order to improve the following deficits and impairments:  Pain, Increased fascial restricitons, Impaired tone, Increased muscle spasms, Decreased activity tolerance, Decreased endurance, Decreased strength, Decreased range of motion, Impaired sensation  Visit Diagnosis: Cervicalgia  Other muscle spasm     Problem List There are no active problems to display for this patient.   DCarney LivingPT DPT  01/31/2018, 8:17 AM  [facility]GDun[place] [address]Phone: [phone]  Fax:  [phone] Name: Jasmine LevelMRN: [id number] of Birth: 9January 12, 1942

## 2018-02-06 ENCOUNTER — Encounter: Payer: Self-pay | Admitting: Physical Therapy

## 2018-02-06 ENCOUNTER — Ambulatory Visit: Payer: Medicare Other | Admitting: Physical Therapy

## 2018-02-06 DIAGNOSIS — M62838 Other muscle spasm: Secondary | ICD-10-CM | POA: Diagnosis not present

## 2018-02-06 DIAGNOSIS — M542 Cervicalgia: Secondary | ICD-10-CM

## 2018-02-06 NOTE — Therapy (Addendum)
[place], [place], [address] Phone: [phone]   Fax:  [phone]  Physical Therapy Treatment/Discharge  Patient Details  Name: Jasmine Vaughan MRN: [id number] Date of Birth: June 27, 1940 Referring Provider: Dr Almedia Balls    Encounter Date: 02/06/2018  PT End of Session - 02/06/18 1152    Visit Number  5    Number of Visits  12    Date for PT Re-Evaluation  02/20/18    PT Start Time  6381    PT Stop Time  1225    PT Time Calculation (min)  40 min       History reviewed. No pertinent past medical history.  Past Surgical History:  Procedure Laterality Date  . BREAST SURGERY      There were no vitals filed for this visit.  Subjective Assessment - 02/06/18 1148    Subjective  About 75% improved. Doing the band exercises. I think we can make this my last day.     Currently in Pain?  No/denies         [facility] PT Assessment - 02/06/18 0001      Observation/Other Assessments   Focus on Therapeutic Outcomes (FOTO)   27% limited improved from 54% limited       AROM   Cervical Flexion  40    Cervical Extension  40    Cervical - Right Side Bend  no pain     Cervical - Left Side Bend  mild pain    Cervical - Right Rotation  60    Cervical - Left Rotation  55      Strength   Right Shoulder Flexion  4+/5                   [facility] Adult PT Treatment/Exercise - 02/06/18 0001      Neck Exercises: Supine   Neck Retraction  10 reps    Other Supine Exercise  supine scap stab yelllow 5 reps x 2 each       Shoulder Exercises: Standing   Extension Limitations  2 x 10 yellow    Row Limitations  2 x 10 yellow      Moist Heat Therapy   Number Minutes Moist Heat  10 Minutes    Moist Heat Location  Cervical      Neck Exercises: Stretches   Upper Trapezius Stretch  3 reps;20 seconds;Right    Levator Stretch  30 seconds;2 reps;Right               PT Short Term Goals - 02/06/18  1200      PT SHORT TERM GOAL #1   Title  Patient will increase neck extension by 10 degrees    Time  3    Period  Weeks    Status  Achieved      PT SHORT TERM GOAL #2   Title  Patient will increase right cervical rotation by 20 degrees     Baseline  15 degree improvement     Time  3    Period  Weeks    Status  Partially Met      PT SHORT TERM GOAL #3   Title  {Patient will increase right shoulder flexion to 4+/5     Baseline  4+/5     Time  3    Period  Weeks    Status  Achieved        PT Long Term Goals - 02/06/18  1200      PT LONG TERM GOAL #1   Title  Patient will demosntrate normal pain free neck motion into right rotation and cervical extension    Baseline  pain free, slight limitation     Time  4    Period  Weeks    Status  Partially Met      PT LONG TERM GOAL #2   Title  Patient will demsotrate a 40% limiation on FOTO to demonstrate improved function     Baseline  27% limited improved from 54% limited     Time  4    Period  Weeks    Status  Achieved            Plan - 02/06/18 1212    Clinical Impression Statement  Pt demonstrates improved cervical AROM, improved shoulder strength and decreased pain. Mild pain with left side bend with pain on left side of neck. Otherwise, she is unable to reproduce any cervical pain and reports no pain since the last two sessions. She requests discharge today due to being pleased with current level of fucntion.     PT Next Visit Plan  continue soft tisue mobilization, manual traction, add scap reatraction; chin tucks; review supine scap stab; modalities PRN. Would benefit from [facility] but is uneasy about trying it.     PT Home Exercise Plan  upper trap stretch, levator stretch; self soft tissue mobilization , scap squeeze, supine chin tuck, supine scap stab yellow band , standing row and extension    Consulted and Agree with Plan of Care  Patient       Patient will benefit from skilled therapeutic intervention in order to  improve the following deficits and impairments:  Pain, Increased fascial restricitons, Impaired tone, Increased muscle spasms, Decreased activity tolerance, Decreased endurance, Decreased strength, Decreased range of motion, Impaired sensation  Visit Diagnosis: Cervicalgia  Other muscle spasm    PHYSICAL THERAPY DISCHARGE SUMMARY  Visits from Start of Care: 5  Current functional level related to goals / functional outcomes: 75% decrease in pain with functional activity    Remaining deficits: Pain at times    Education / Equipment: HEP   Plan: Patient agrees to discharge.  Patient goals were partially met. Patient is being discharged due to not returning since the last visit.  ?????       Problem List There are no active problems to display for this patient.  Carolyne Littles PT DPT  02/06/2018  Dorene Ar, PTA 02/06/2018, 12:30 PM  [facility] [address] [place], [place], [address] Phone: [phone]   Fax:  [phone]  Name: Kanesha Cadle MRN: [id number] Date of Birth: 24-Jan-1941

## 2018-03-20 DIAGNOSIS — I10 Essential (primary) hypertension: Secondary | ICD-10-CM | POA: Diagnosis not present

## 2018-03-20 DIAGNOSIS — E785 Hyperlipidemia, unspecified: Secondary | ICD-10-CM | POA: Diagnosis not present

## 2018-03-20 DIAGNOSIS — R7303 Prediabetes: Secondary | ICD-10-CM | POA: Diagnosis not present

## 2018-03-20 DIAGNOSIS — M542 Cervicalgia: Secondary | ICD-10-CM | POA: Diagnosis not present

## 2018-03-20 DIAGNOSIS — I119 Hypertensive heart disease without heart failure: Secondary | ICD-10-CM | POA: Diagnosis not present

## 2018-04-11 ENCOUNTER — Encounter: Payer: Self-pay | Admitting: Physical Therapy

## 2018-04-17 DIAGNOSIS — Z853 Personal history of malignant neoplasm of breast: Secondary | ICD-10-CM | POA: Diagnosis not present

## 2018-04-17 DIAGNOSIS — Z1231 Encounter for screening mammogram for malignant neoplasm of breast: Secondary | ICD-10-CM | POA: Diagnosis not present

## 2018-04-17 DIAGNOSIS — M85852 Other specified disorders of bone density and structure, left thigh: Secondary | ICD-10-CM | POA: Diagnosis not present

## 2018-04-20 ENCOUNTER — Other Ambulatory Visit: Payer: Self-pay

## 2018-04-20 NOTE — Patient Outreach (Signed)
[facility]) Care Management  04/20/2018  Jasmine Vaughan [birth year] [id number]   Medication Adherence call to Jasmine Vaughan spoke with patient she still has a full bottle patient forgets to take it sometimes. she said don't order it until she is finished with what she has  patient is due on Pravastatin 10 mg. Jasmine Vaughan is showing past due under [facility].   Lillia Abed CPhT Pharmacy Technician [facility] Management Direct Dial [phone]  Fax [phone] [email or web address]

## 2018-04-24 DIAGNOSIS — I10 Essential (primary) hypertension: Secondary | ICD-10-CM | POA: Diagnosis not present

## 2018-04-24 DIAGNOSIS — R7303 Prediabetes: Secondary | ICD-10-CM | POA: Diagnosis not present

## 2018-04-24 DIAGNOSIS — E785 Hyperlipidemia, unspecified: Secondary | ICD-10-CM | POA: Diagnosis not present

## 2018-04-24 DIAGNOSIS — Z79899 Other long term (current) drug therapy: Secondary | ICD-10-CM | POA: Diagnosis not present

## 2018-05-18 DIAGNOSIS — Z0001 Encounter for general adult medical examination with abnormal findings: Secondary | ICD-10-CM | POA: Diagnosis not present

## 2018-05-23 ENCOUNTER — Emergency Department (HOSPITAL_COMMUNITY): Payer: Medicare Other

## 2018-05-23 ENCOUNTER — Encounter (HOSPITAL_COMMUNITY): Payer: Self-pay | Admitting: *Deleted

## 2018-05-23 ENCOUNTER — Emergency Department (HOSPITAL_COMMUNITY)
Admission: EM | Admit: 2018-05-23 | Discharge: 2018-05-23 | Disposition: A | Discharge status: Home / Self Care | Payer: Medicare Other | Attending: Emergency Medicine | Admitting: Emergency Medicine

## 2018-05-23 DIAGNOSIS — I10 Essential (primary) hypertension: Secondary | ICD-10-CM | POA: Diagnosis not present

## 2018-05-23 DIAGNOSIS — R1032 Left lower quadrant pain: Secondary | ICD-10-CM | POA: Diagnosis not present

## 2018-05-23 DIAGNOSIS — Y999 Unspecified external cause status: Secondary | ICD-10-CM | POA: Diagnosis not present

## 2018-05-23 DIAGNOSIS — X509XXA Other and unspecified overexertion or strenuous movements or postures, initial encounter: Secondary | ICD-10-CM | POA: Insufficient documentation

## 2018-05-23 DIAGNOSIS — Z008 Encounter for other general examination: Secondary | ICD-10-CM | POA: Diagnosis not present

## 2018-05-23 DIAGNOSIS — S39012A Strain of muscle, fascia and tendon of lower back, initial encounter: Secondary | ICD-10-CM

## 2018-05-23 DIAGNOSIS — Y939 Activity, unspecified: Secondary | ICD-10-CM | POA: Diagnosis not present

## 2018-05-23 DIAGNOSIS — Y929 Unspecified place or not applicable: Secondary | ICD-10-CM | POA: Diagnosis not present

## 2018-05-23 DIAGNOSIS — K5730 Diverticulosis of large intestine without perforation or abscess without bleeding: Secondary | ICD-10-CM | POA: Diagnosis not present

## 2018-05-23 DIAGNOSIS — R109 Unspecified abdominal pain: Secondary | ICD-10-CM

## 2018-05-23 LAB — URINALYSIS, ROUTINE W REFLEX MICROSCOPIC
BILIRUBIN URINE: NEGATIVE
Glucose, UA: NEGATIVE mg/dL
Hgb urine dipstick: NEGATIVE
KETONES UR: NEGATIVE mg/dL
Leukocytes, UA: NEGATIVE
NITRITE: NEGATIVE
Protein, ur: NEGATIVE mg/dL
SPECIFIC GRAVITY, URINE: 1.002 — AB (ref 1.005–1.030)
pH: 8 (ref 5.0–8.0)

## 2018-05-23 LAB — CBC
HCT: 46.8 % — ABNORMAL HIGH (ref 36.0–46.0)
HEMOGLOBIN: 14.3 g/dL (ref 12.0–15.0)
MCH: 26.5 pg (ref 26.0–34.0)
MCHC: 30.6 g/dL (ref 30.0–36.0)
MCV: 86.8 fL (ref 80.0–100.0)
NRBC: 0 % (ref 0.0–0.2)
Platelets: 405 10*3/uL — ABNORMAL HIGH (ref 150–400)
RBC: 5.39 MIL/uL — ABNORMAL HIGH (ref 3.87–5.11)
RDW: 13.3 % (ref 11.5–15.5)
WBC: 4.2 10*3/uL (ref 4.0–10.5)

## 2018-05-23 LAB — COMPREHENSIVE METABOLIC PANEL
ALT: 16 U/L (ref 0–44)
ANION GAP: 11 (ref 5–15)
AST: 19 U/L (ref 15–41)
Albumin: 3.8 g/dL (ref 3.5–5.0)
Alkaline Phosphatase: 58 U/L (ref 38–126)
BUN: 7 mg/dL — AB (ref 8–23)
CHLORIDE: 103 mmol/L (ref 98–111)
CO2: 27 mmol/L (ref 22–32)
Calcium: 9.3 mg/dL (ref 8.9–10.3)
Creatinine, Ser: 0.76 mg/dL (ref 0.44–1.00)
Glucose, Bld: 123 mg/dL — ABNORMAL HIGH (ref 70–99)
POTASSIUM: 3.6 mmol/L (ref 3.5–5.1)
Sodium: 141 mmol/L (ref 135–145)
Total Bilirubin: 0.6 mg/dL (ref 0.3–1.2)
Total Protein: 7.8 g/dL (ref 6.5–8.1)

## 2018-05-23 LAB — LIPASE, BLOOD: LIPASE: 47 U/L (ref 11–51)

## 2018-05-23 MED ORDER — LACTATED RINGERS IV BOLUS
1000.0000 mL | Freq: Once | INTRAVENOUS | Status: AC
  Administered 2018-05-23: 1000 mL via INTRAVENOUS

## 2018-05-23 MED ORDER — FENTANYL CITRATE (PF) 100 MCG/2ML IJ SOLN
50.0000 ug | Freq: Once | INTRAMUSCULAR | Status: AC
  Administered 2018-05-23: 50 ug via INTRAVENOUS
  Filled 2018-05-23: qty 2

## 2018-05-23 MED ORDER — IOPAMIDOL (ISOVUE-300) INJECTION 61%
100.0000 mL | Freq: Once | INTRAVENOUS | Status: AC | PRN
  Administered 2018-05-23: 100 mL via INTRAVENOUS

## 2018-05-23 NOTE — Discharge Instructions (Addendum)
Continue Tylenol and Motrin as needed.  Follow-up with primary care doctor for further work-up.  Return to ED if symptoms worsen.

## 2018-05-23 NOTE — ED Triage Notes (Signed)
Pt in c/o LLQ abdominal pain that first started in November, pain has gotten progressively worse and increased last night, also complains of nausea, denies vomiting or diarrhea

## 2018-05-23 NOTE — ED Notes (Signed)
Patient verbalizes understanding of discharge instructions. Opportunity for questioning and answers were provided. Armband removed by staff, pt discharged from ED. Pt wheeled to lobby and taken home by family 

## 2018-05-23 NOTE — ED Provider Notes (Signed)
[facility] EMERGENCY DEPARTMENT Provider Note   CSN: [id number] Arrival date & time: 05/23/18  1034     History   Chief Complaint Chief Complaint  Patient presents with  . Abdominal Pain    HPI Cecilie Heidel is a 77 y.o. female.  The history is provided by the patient.  Abdominal Pain   This is a new problem. The current episode started yesterday. The problem occurs constantly. The problem has not changed since onset.Associated with: Increased activity yesterday setting up holiday decorations.  The pain is located in the LLQ (left lower back). The quality of the pain is aching and dull. The pain is at a severity of 3/10. The pain is moderate. Pertinent negatives include anorexia, fever, belching, diarrhea, flatus, hematochezia, melena, nausea, vomiting, constipation, dysuria, frequency, hematuria, headaches, arthralgias and myalgias. The symptoms are aggravated by certain positions. Nothing relieves the symptoms. Past workup does not include surgery.    History reviewed. No pertinent past medical history.  There are no active problems to display for this patient.   Past Surgical History:  Procedure Laterality Date  . BREAST SURGERY       OB History   None      Home Medications    Prior to Admission medications   Medication Sig Start Date End Date Taking? Authorizing Provider  acetaminophen (TYLENOL) 325 MG tablet Take 650 mg by mouth every 6 (six) hours as needed.    [provider]  HYDROcodone-acetaminophen (NORCO/VICODIN) 5-325 MG tablet Take 1 tablet by mouth every 6 (six) hours as needed for moderate pain.    [provider]  Menthol, Topical Analgesic, (BENGAY EX) Apply topically.    [provider]  Menthol, Topical Analgesic, (BIOFREEZE EX) Apply topically.    [provider]  triamcinolone cream (KENALOG) 0.1 % Apply 1 application topically 2 (two) times daily. Patient not taking: Reported on  01/09/2018 07/31/17   Silvestre Mesi    Family History Family History  Family history unknown: Yes    Social History Social History   Tobacco Use  . Smoking status: Never Smoker  . Smokeless tobacco: Never Used  Substance Use Topics  . Alcohol use: No  . Drug use: No     Allergies   Patient has no known allergies.   Review of Systems Review of Systems  Constitutional: Negative for chills and fever.  HENT: Negative for ear pain and sore throat.   Eyes: Negative for pain and visual disturbance.  Respiratory: Negative for cough and shortness of breath.   Cardiovascular: Negative for chest pain and palpitations.  Gastrointestinal: Positive for abdominal pain. Negative for anorexia, constipation, diarrhea, flatus, hematochezia, melena, nausea and vomiting.  Genitourinary: Negative for dysuria, frequency and hematuria.  Musculoskeletal: Positive for back pain. Negative for arthralgias and myalgias.  Skin: Negative for color change and rash.  Neurological: Negative for seizures, syncope and headaches.  All other systems reviewed and are negative.    Physical Exam Updated Vital Signs  ED Triage Vitals  Enc Vitals Group     BP 05/23/18 1047 (!) 164/101     Pulse Rate 05/23/18 1047 85     Resp 05/23/18 1047 20     Temp 05/23/18 1047 98 F (36.7 C)     Temp Source 05/23/18 1047 Oral     SpO2 05/23/18 1047 100 %     Weight --      Height --      Head Circumference --  Peak Flow --      Pain Score 05/23/18 1045 8     Pain Loc --      Pain Edu? --      Excl. in GC? --     Physical Exam  Constitutional: She is oriented to person, place, and time. She appears well-developed and well-nourished. No distress.  HENT:  Head: Normocephalic and atraumatic.  Mouth/Throat: No oropharyngeal exudate.  Eyes: Conjunctivae and EOM are normal.  Neck: Neck supple.  Cardiovascular: Normal rate and regular rhythm.  No murmur heard. Pulmonary/Chest: Effort normal and  breath sounds normal. No respiratory distress.  Abdominal: Soft. Normal appearance and bowel sounds are normal. There is tenderness in the left lower quadrant. There is no rigidity, no rebound, no guarding, no CVA tenderness, no tenderness at McBurney's point and negative Murphy's sign.  Musculoskeletal: She exhibits no edema.  No midline spinal tenderness, patient is tender over the paraspinal lumbar muscles on the left side  Neurological: She is alert and oriented to person, place, and time.  5+/5 strength, normal sensation, no drift, normal finger to nose finger  Skin: Skin is warm and dry. Capillary refill takes less than 2 seconds.  Psychiatric: She has a normal mood and affect.  Nursing note and vitals reviewed.    ED Treatments / Results  Labs (all labs ordered are listed, but only abnormal results are displayed) Labs Reviewed  COMPREHENSIVE METABOLIC PANEL - Abnormal; Notable for the following components:      Result Value   Glucose, Bld 123 (*)    BUN 7 (*)    All other components within normal limits  CBC - Abnormal; Notable for the following components:   RBC 5.39 (*)    HCT 46.8 (*)    Platelets 405 (*)    All other components within normal limits  URINALYSIS, ROUTINE W REFLEX MICROSCOPIC - Abnormal; Notable for the following components:   Color, Urine COLORLESS (*)    Specific Gravity, Urine 1.002 (*)    All other components within normal limits  LIPASE, BLOOD    EKG None  Radiology Ct Abdomen Pelvis W Contrast  Result Date: 05/23/2018 CLINICAL DATA:  Intermittent left side abdominal pain since November, 2019. EXAM: CT ABDOMEN AND PELVIS WITH CONTRAST TECHNIQUE: Multidetector CT imaging of the abdomen and pelvis was performed using the standard protocol following bolus administration of intravenous contrast. CONTRAST:  100 mL ISOVUE-300 IOPAMIDOL (ISOVUE-300) INJECTION 61% COMPARISON:  None. FINDINGS: Lower chest: Minimal dependent atelectasis is seen in the lung  bases. No pleural or pericardial effusion. Hepatobiliary: No focal liver abnormality is seen. No gallstones, gallbladder wall thickening, or biliary dilatation. Pancreas: Unremarkable. No pancreatic ductal dilatation or surrounding inflammatory changes. Spleen: Normal in size without focal abnormality. Adrenals/Urinary Tract: Adrenal glands are unremarkable. Kidneys are normal, without renal calculi, or hydronephrosis. Parapelvic left renal cysts noted. Bladder is unremarkable. Stomach/Bowel: Scattered diverticulosis without diverticulitis noted. The colon is otherwise unremarkable. The stomach, small bowel and appendix are normal in appearance. Vascular/Lymphatic: No significant vascular findings are present. No enlarged abdominal or pelvic lymph nodes. Retroaortic left renal vein incidentally noted. Reproductive: Uterus and bilateral adnexa are unremarkable. Other: None. Musculoskeletal: No acute or focal abnormality.  Scoliosis noted. IMPRESSION: No finding to explain the patient's symptoms. No acute abnormality abdomen or pelvis. Diverticulosis without diverticulitis. Electronically Signed   By: Drusilla Kanner M.D.   On: 05/23/2018 13:34    Procedures Procedures (including critical care time)  Medications Ordered in ED Medications  lactated  ringers bolus 1,000 mL (0 mLs Intravenous Stopped 05/23/18 1407)  fentaNYL (SUBLIMAZE) injection 50 mcg (50 mcg Intravenous Given 05/23/18 1157)  iopamidol (ISOVUE-300) 61 % injection 100 mL (100 mLs Intravenous Contrast Given 05/23/18 1324)     Initial Impression / Assessment and Plan / ED Course  I have reviewed the triage vital signs and the nursing notes.  Pertinent labs & imaging results that were available during my care of the patient were reviewed by me and considered in my medical decision making (see chart for details).     Clotilde DieterBarbara Yepez is a 77 year old female with no significant medical history who presents to the ED with left lower  quadrant abdominal pain, left lower back pain.  Patient with normal vitals.  No fever.  Patient with pain acutely got worse last night after setting of holiday decorations.  She states that she was wearing a support belt while doing her heavy lifting but now has pain in the left lower quadrant and left lower back on exam.  No nausea, no vomiting, no diarrhea.  States that she has had some intermittent pain in the past and these areas.  Has no history of abdominal surgery.  Patient with overall unremarkable exam except for some mild tenderness in the left lower quadrant and left paraspinal lumbar area.  Patient denies any urinary symptoms.  Patient given IV fluids, IV fentanyl.  Patient with CT abdomen and pelvis ordered that was overall unremarkable.  No significant leukocytosis, anemia, electrolyte abnormality, kidney injury.  Urinalysis negative for UTI.  Patient feels improved following fluids and pain medicine.  Given unremarkable work-up likely concern for musculoskeletal nature of pain.  Recommend Tylenol, Motrin at home.  Recommend follow-up with primary care doctor and discharged from ED in good condition.  This chart was dictated using voice recognition software.  Despite best efforts to proofread,  errors can occur which can change the documentation meaning.   Final Clinical Impressions(s) / ED Diagnoses   Final diagnoses:  Abdominal pain, unspecified abdominal location  Back strain, initial encounter    ED Discharge Orders    None       Virgina NorfolkCuratolo, Jalyne Brodzinski, DO 05/23/18 1605

## 2018-05-23 NOTE — ED Notes (Signed)
Patient transported to CT 

## 2018-05-23 NOTE — ED Notes (Signed)
ED Provider at bedside. 

## 2018-05-25 ENCOUNTER — Ambulatory Visit
Admission: RE | Admit: 2018-05-25 | Discharge: 2018-05-25 | Disposition: A | Discharge status: Home / Self Care | Payer: Medicare Other | Source: Ambulatory Visit | Attending: Internal Medicine | Admitting: Internal Medicine

## 2018-05-25 ENCOUNTER — Other Ambulatory Visit: Payer: Self-pay | Admitting: Internal Medicine

## 2018-05-25 DIAGNOSIS — S79912A Unspecified injury of left hip, initial encounter: Secondary | ICD-10-CM | POA: Diagnosis not present

## 2018-05-25 DIAGNOSIS — S79911A Unspecified injury of right hip, initial encounter: Secondary | ICD-10-CM | POA: Diagnosis not present

## 2018-05-25 DIAGNOSIS — M25551 Pain in right hip: Secondary | ICD-10-CM

## 2018-05-25 DIAGNOSIS — M25552 Pain in left hip: Principal | ICD-10-CM

## 2018-05-25 DIAGNOSIS — R1032 Left lower quadrant pain: Secondary | ICD-10-CM | POA: Diagnosis not present

## 2019-06-12 IMAGING — CR DG HIP (WITH OR WITHOUT PELVIS) 3-4V BILAT
4 series · 4 of 4 positions shown · non-contrast
Comparison: None.

CLINICAL DATA: 77 y/o F; left-greater-than-right bilateral hip pain
for months. Recent fall.

EXAM:
DG HIP (WITH OR WITHOUT PELVIS) 3-4V BILAT

[w hip ap left]
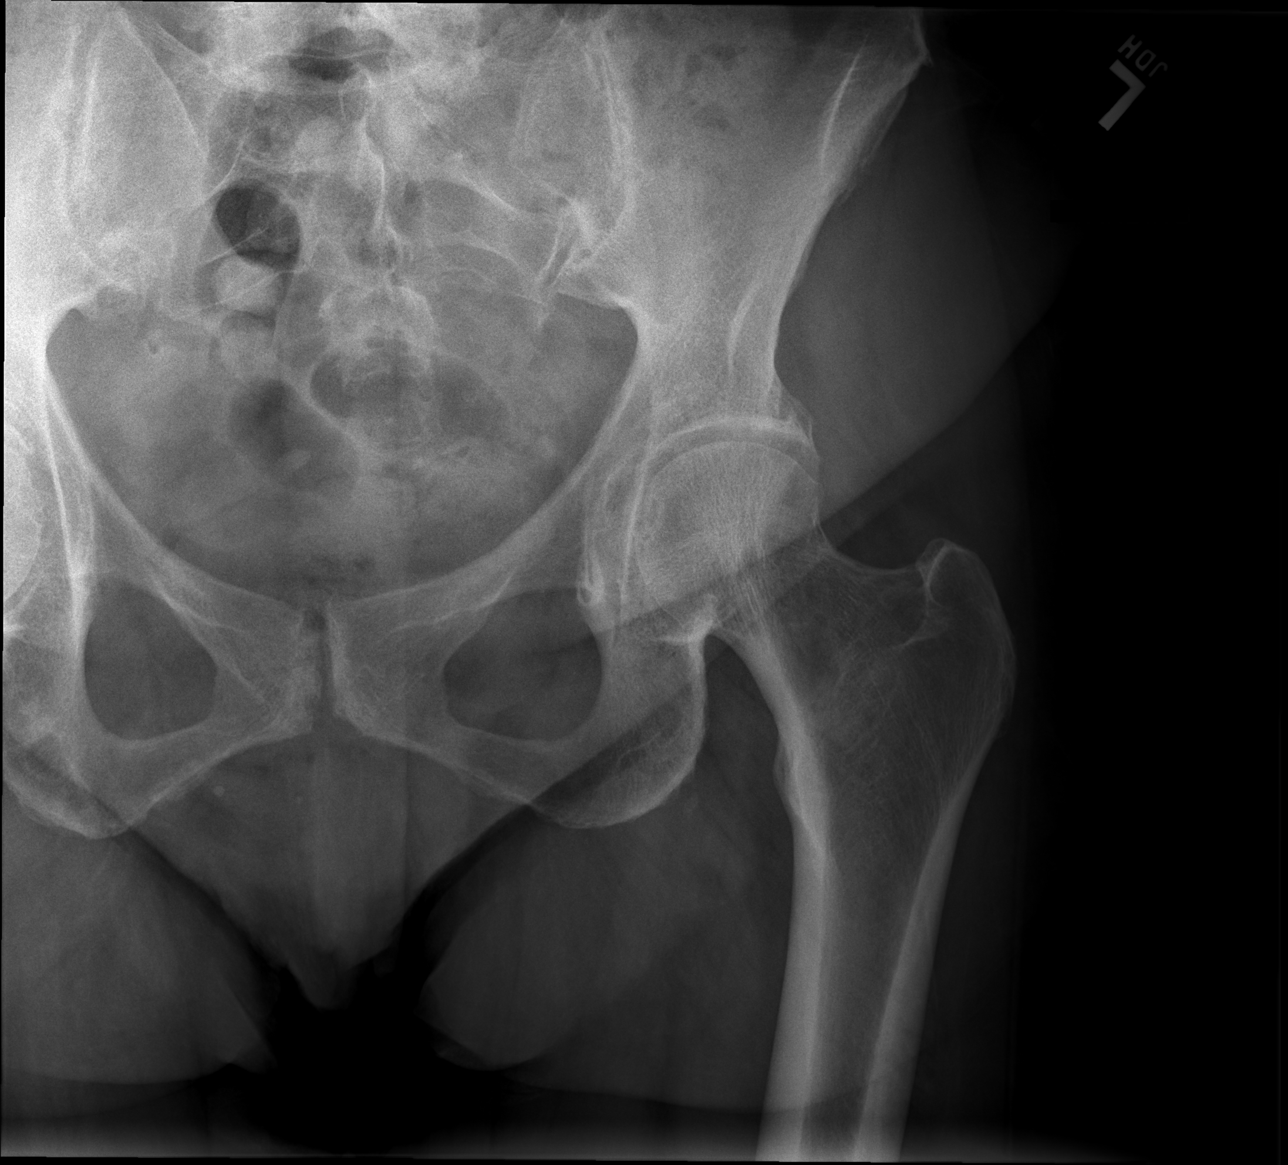

[w hip frog left]
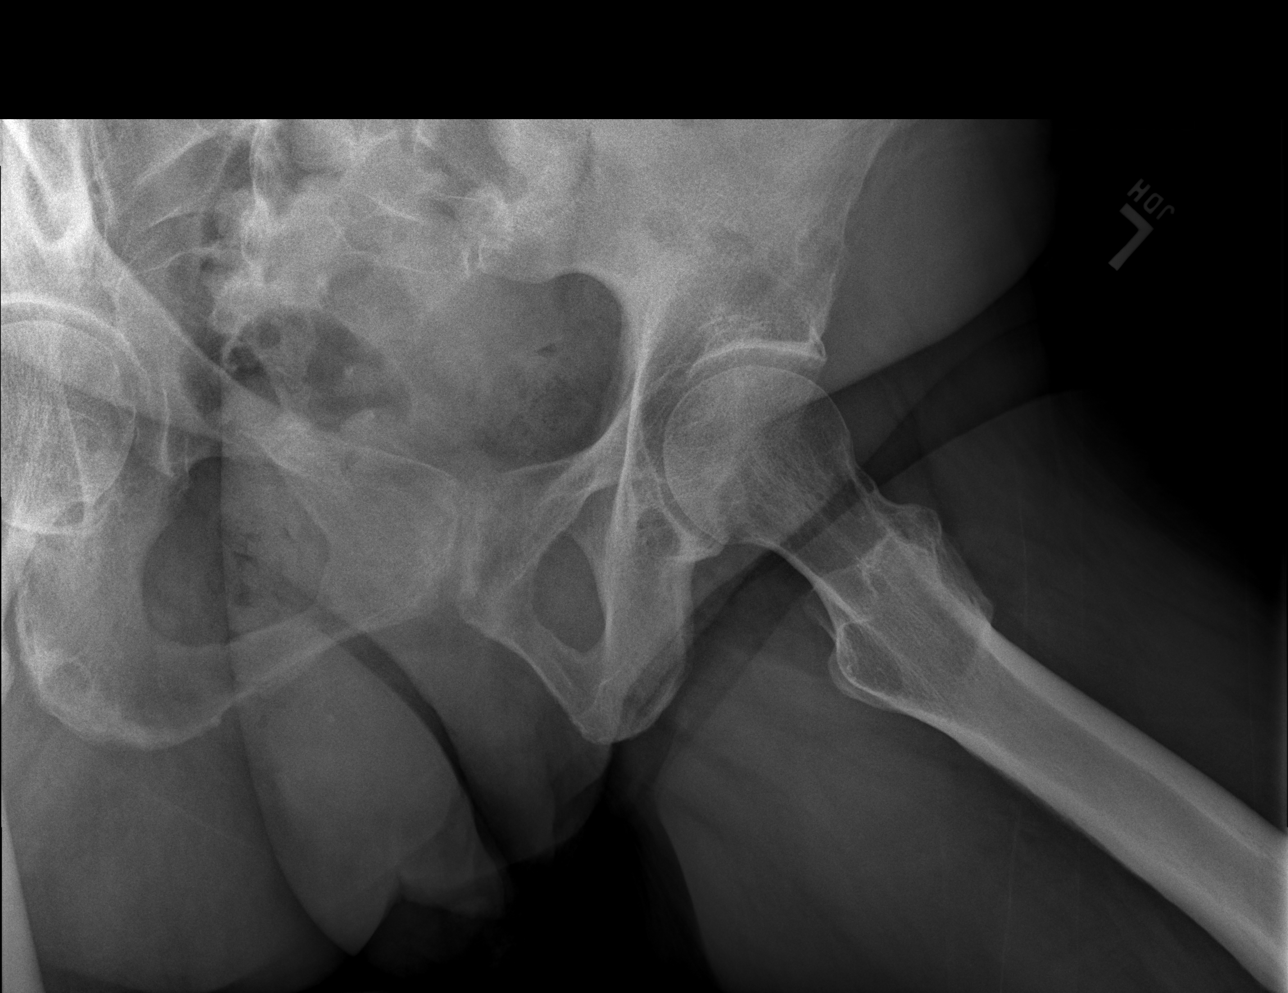

[w hip ap right]
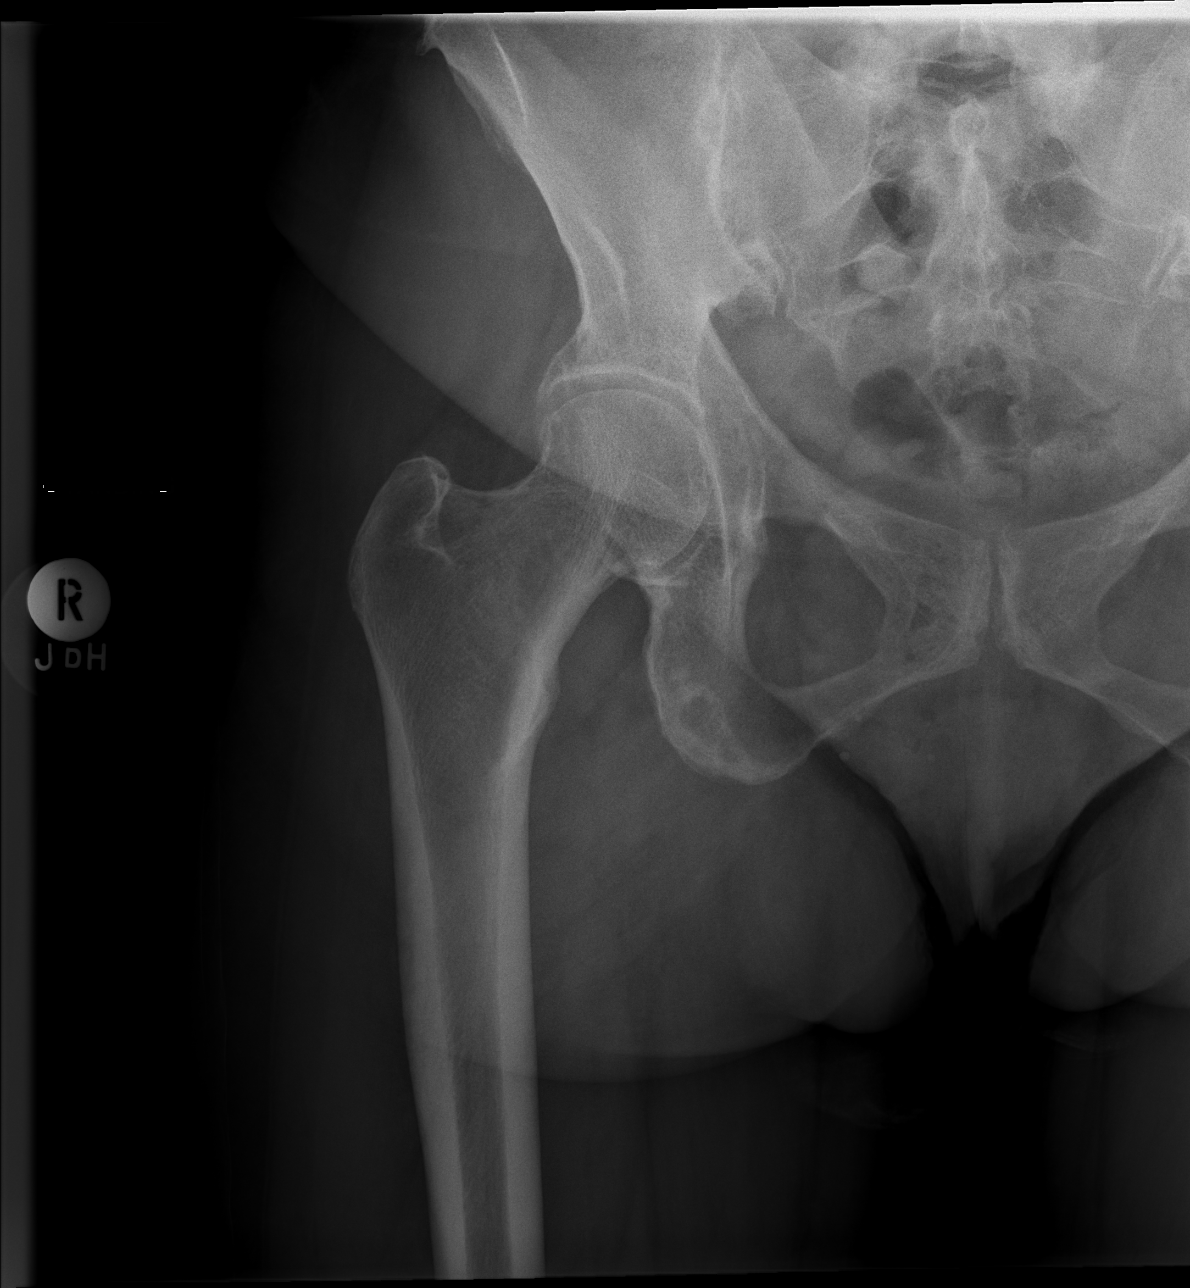

[w hip frog right]
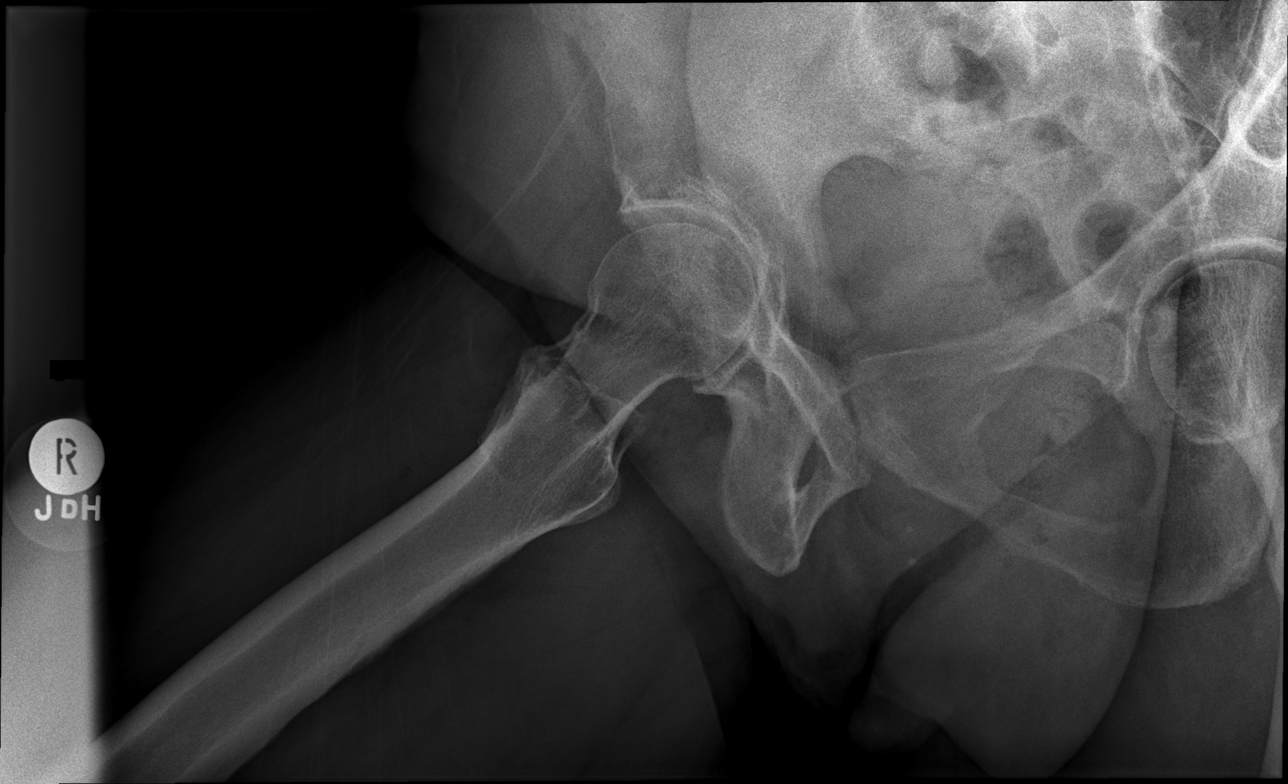

[4 of 4 positions shown; findings below may reference images not displayed]

FINDINGS: There is no evidence of hip fracture or dislocation. Mild
osteoarthrosis of the bilateral hip joints with loss of the joint
space and productive changes of the acetabulum. Sacroiliac joints
and symphysis pubis are maintained.
IMPRESSION: 1. No acute fracture or dislocation identified.
2. Mild bilateral hip osteoarthrosis.

## 2019-11-01 ENCOUNTER — Ambulatory Visit: Payer: Medicare Other | Attending: Internal Medicine

## 2019-11-01 DIAGNOSIS — Z23 Encounter for immunization: Secondary | ICD-10-CM

## 2019-11-01 NOTE — Progress Notes (Signed)
   Covid-19 Vaccination Clinic  Name:  Brandy Zuba    MRN: [id number] DOB: 1940-08-31  11/01/2019  Ms. Graybill was observed post Covid-19 immunization for 15 minutes without incident. She was provided with Vaccine Information Sheet and instruction to access the V-Safe system.   Ms. Verhagen was instructed to call 911 with any severe reactions post vaccine: Marland Kitchen Difficulty breathing  . Swelling of face and throat  . A fast heartbeat  . A bad rash all over body  . Dizziness and weakness   Immunizations Administered    Name Date Dose VIS Date Route   Pfizer COVID-19 Vaccine 11/01/2019  9:03 AM 0.3 mL 08/08/2018 Intramuscular   Manufacturer: [employer], [employer]   Lot: [id number]   NDC: [address]-0

## 2019-11-26 ENCOUNTER — Ambulatory Visit: Payer: Medicare Other | Attending: Internal Medicine

## 2019-11-26 DIAGNOSIS — Z23 Encounter for immunization: Secondary | ICD-10-CM

## 2019-11-26 NOTE — Progress Notes (Signed)
   Covid-19 Vaccination Clinic  Name:  Jasmine Vaughan    MRN: [id number] DOB: 12/23/1940  11/26/2019  [name] was observed post Covid-19 immunization for 15 minutes without incident. She was provided with Vaccine Information Sheet and instruction to access the V-Safe system.   [name] was instructed to call 911 with any severe reactions post vaccine: Marland Kitchen Difficulty breathing  . Swelling of face and throat  . A fast heartbeat  . A bad rash all over body  . Dizziness and weakness   Immunizations Administered    Name Date Dose VIS Date Route   Pfizer COVID-19 Vaccine 11/26/2019  9:09 AM 0.3 mL 08/08/2018 Intramuscular   Manufacturer: [employer], [employer]   Lot: [id number]   NDC: [address]-2

## 2020-12-18 ENCOUNTER — Other Ambulatory Visit: Payer: Self-pay

## 2020-12-18 ENCOUNTER — Ambulatory Visit (HOSPITAL_COMMUNITY)
Admission: EM | Admit: 2020-12-18 | Discharge: 2020-12-18 | Disposition: A | Discharge status: Home / Self Care | Payer: Medicare Other

## 2020-12-18 ENCOUNTER — Encounter (HOSPITAL_COMMUNITY): Payer: Self-pay

## 2020-12-18 DIAGNOSIS — S1016XA Insect bite (nonvenomous) of throat, initial encounter: Secondary | ICD-10-CM | POA: Diagnosis not present

## 2020-12-18 DIAGNOSIS — W57XXXA Bitten or stung by nonvenomous insect and other nonvenomous arthropods, initial encounter: Secondary | ICD-10-CM | POA: Diagnosis not present

## 2020-12-18 DIAGNOSIS — L089 Local infection of the skin and subcutaneous tissue, unspecified: Secondary | ICD-10-CM

## 2020-12-18 MED ORDER — PREDNISONE 20 MG PO TABS
40.0000 mg | ORAL_TABLET | Freq: Every day | ORAL | 0 refills | Status: AC
Start: 2020-12-18 — End: 2020-12-23

## 2020-12-18 NOTE — ED Triage Notes (Signed)
Pt reports was stung by a wasp Tuesday night on left side of neck. Pt now has swollen area where sting mark is, spreading down into chest. Pt now reports itching and burning on neck and chest.  Pt treated area at home with tobacco.

## 2020-12-18 NOTE — ED Provider Notes (Signed)
[facility]    CSN: [id number] Arrival date & time: 12/18/20  1218      History   Chief Complaint Chief Complaint  Patient presents with   Insect Bite    HPI Jasmine Vaughan is a 80 y.o. female presenting with localized allergic reaction following wasp sting that occurred 4 days ago.  States that it stung the left side of her neck, now with some swelling and itching.  Has tried rubbing tobacco on it with minimal improvement.  Denies facial swelling, shortness of breath, dizziness, chest pain, nausea.  HPI  History reviewed. No pertinent past medical history.  There are no problems to display for this patient.   Past Surgical History:  Procedure Laterality Date   BREAST SURGERY      OB History   No obstetric history on file.      Home Medications    Prior to Admission medications   Medication Sig Start Date End Date Taking? Authorizing Provider  acetaminophen (TYLENOL) 325 MG tablet Take 650 mg by mouth every 6 (six) hours as needed.   Yes [provider]  HYDROcodone-acetaminophen (NORCO/VICODIN) 5-325 MG tablet Take 1 tablet by mouth every 6 (six) hours as needed for moderate pain.   Yes [provider]  MELOXICAM PO Take by mouth.   Yes [provider]  Menthol, Topical Analgesic, (BENGAY EX) Apply topically.   Yes [provider]  Menthol, Topical Analgesic, (BIOFREEZE EX) Apply topically.   Yes [provider]  predniSONE (DELTASONE) 20 MG tablet Take 2 tablets (40 mg total) by mouth daily for 5 days. 12/18/20 12/23/20 Yes Rhys Martini, PA-C  triamcinolone cream (KENALOG) 0.1 % Apply 1 application topically 2 (two) times daily. Patient not taking: Reported on 01/09/2018 07/31/17   Ofilia Neas, PA-C    Family History Family History  Family history unknown: Yes    Social History Social History   Tobacco Use   Smoking status: Never   Smokeless tobacco: Never  Vaping Use   Vaping Use: Never  used  Substance Use Topics   Alcohol use: No   Drug use: No     Allergies   Bee venom, Other, Yellow jacket venom, and Wasp venom   Review of Systems Review of Systems  Skin:  Positive for color change.    Physical Exam Triage Vital Signs ED Triage Vitals  Enc Vitals Group     BP 12/18/20 1331 139/66     Pulse Rate 12/18/20 1331 91     Resp 12/18/20 1331 18     Temp 12/18/20 1331 98.3 F (36.8 C)     Temp Source 12/18/20 1331 Oral     SpO2 12/18/20 1331 99 %     Weight --      Height --      Head Circumference --      Peak Flow --      Pain Score 12/18/20 1326 8     Pain Loc --      Pain Edu? --      Excl. in GC? --    No data found.  Updated Vital Signs BP 139/66 (BP Location: Right Arm)   Pulse 91   Temp 98.3 F (36.8 C) (Oral)   Resp 18   SpO2 99%   Visual Acuity Right Eye Distance:   Left Eye Distance:   Bilateral Distance:    Right Eye Near:   Left Eye Near:    Bilateral Near:  Physical Exam Vitals reviewed.  Constitutional:      General: She is not in acute distress.    Appearance: Normal appearance. She is not ill-appearing or diaphoretic.  HENT:     Head: Normocephalic and atraumatic.  Cardiovascular:     Rate and Rhythm: Normal rate and regular rhythm.     Heart sounds: Normal heart sounds.  Pulmonary:     Effort: Pulmonary effort is normal.     Breath sounds: Normal breath sounds.  Skin:    General: Skin is warm.     Comments: Base of L neck with 2x2cm area of faint ecchymosis and very mild effusion. Nontender. Absolutely no facial, pharyngeal, lip, tongue swelling.  Neurological:     General: No focal deficit present.     Mental Status: She is alert and oriented to person, place, and time.  Psychiatric:        Mood and Affect: Mood normal.        Behavior: Behavior normal.        Thought Content: Thought content normal.        Judgment: Judgment normal.     UC Treatments / Results  Labs (all labs ordered are listed, but  only abnormal results are displayed) Labs Reviewed - No data to display  EKG   Radiology No results found.  Procedures Procedures (including critical care time)  Medications Ordered in UC Medications - No data to display  Initial Impression / Assessment and Plan / UC Course  I have reviewed the triage vital signs and the nursing notes.  Pertinent labs & imaging results that were available during my care of the patient were reviewed by me and considered in my medical decision making (see chart for details).     This patient is a very pleasant 80 y.o. year old female presenting with localized allergic response following wasp sting 4 days ago. Prednisone and benedryl. ED return precautions discussed. Patient verbalizes understanding and agreement. No hx anaphylaxis.    Final Clinical Impressions(s) / UC Diagnoses   Final diagnoses:  Skin inflammation  Insect bite of throat, initial encounter     Discharge Instructions      -Prednisone, 2 pills taken at the same time for 5 days in a row.  Try taking this earlier in the day as it can give you energy. Avoid NSAIDs like ibuprofen and alleve while taking this medication as they can increase your risk of stomach upset and even GI bleeding when in combination with a steroid. You can continue tylenol (acetaminophen) up to 1000mg  3x daily. -Benedryl for symptomatic relief -Follow-up with facial/lip/tongue swelling, shortness of breath   ED Prescriptions     Medication Sig Dispense Auth. Provider   predniSONE (DELTASONE) 20 MG tablet Take 2 tablets (40 mg total) by mouth daily for 5 days. 10 tablet , PA-C      PDMP not reviewed this encounter.   Rhys Martini, PA-C 12/18/20 1414

## 2020-12-18 NOTE — Discharge Instructions (Addendum)
-  Prednisone, 2 pills taken at the same time for 5 days in a row.  Try taking this earlier in the day as it can give you energy. Avoid NSAIDs like ibuprofen and alleve while taking this medication as they can increase your risk of stomach upset and even GI bleeding when in combination with a steroid. You can continue tylenol (acetaminophen) up to 1000mg  3x daily. -Benedryl for symptomatic relief -Follow-up with facial/lip/tongue swelling, shortness of breath

## 2021-10-09 ENCOUNTER — Ambulatory Visit (INDEPENDENT_AMBULATORY_CARE_PROVIDER_SITE_OTHER): Payer: Medicare Other

## 2021-10-09 ENCOUNTER — Encounter (HOSPITAL_COMMUNITY): Payer: Self-pay

## 2021-10-09 ENCOUNTER — Ambulatory Visit (HOSPITAL_COMMUNITY)
Admission: EM | Admit: 2021-10-09 | Discharge: 2021-10-09 | Disposition: A | Discharge status: Home / Self Care | Payer: Medicare Other | Attending: Family Medicine | Admitting: Family Medicine

## 2021-10-09 DIAGNOSIS — U071 COVID-19: Secondary | ICD-10-CM | POA: Insufficient documentation

## 2021-10-09 DIAGNOSIS — R112 Nausea with vomiting, unspecified: Secondary | ICD-10-CM | POA: Diagnosis present

## 2021-10-09 DIAGNOSIS — R059 Cough, unspecified: Secondary | ICD-10-CM

## 2021-10-09 DIAGNOSIS — R509 Fever, unspecified: Secondary | ICD-10-CM | POA: Diagnosis not present

## 2021-10-09 DIAGNOSIS — J029 Acute pharyngitis, unspecified: Secondary | ICD-10-CM

## 2021-10-09 DIAGNOSIS — R051 Acute cough: Secondary | ICD-10-CM

## 2021-10-09 LAB — SARS CORONAVIRUS 2 (TAT 6-24 HRS): SARS Coronavirus 2: POSITIVE — AB

## 2021-10-09 LAB — POCT RAPID STREP A, ED / UC: Streptococcus, Group A Screen (Direct): NEGATIVE

## 2021-10-09 LAB — POC INFLUENZA A AND B ANTIGEN (URGENT CARE ONLY)
INFLUENZA A ANTIGEN, POC: NEGATIVE
INFLUENZA B ANTIGEN, POC: NEGATIVE

## 2021-10-09 MED ORDER — ACETAMINOPHEN 325 MG PO TABS
ORAL_TABLET | ORAL | Status: AC
  Filled 2021-10-09: qty 2

## 2021-10-09 MED ORDER — ONDANSETRON 4 MG PO TBDP
4.0000 mg | ORAL_TABLET | Freq: Once | ORAL | Status: AC
  Administered 2021-10-09: 4 mg via ORAL

## 2021-10-09 MED ORDER — ONDANSETRON HCL 4 MG PO TABS: 4.0000 mg | ORAL_TABLET | Freq: Four times a day (QID) | ORAL | 0 refills | Status: DC

## 2021-10-09 MED ORDER — ACETAMINOPHEN 325 MG PO TABS
650.0000 mg | ORAL_TABLET | Freq: Once | ORAL | Status: AC
  Administered 2021-10-09: 650 mg via ORAL

## 2021-10-09 MED ORDER — ONDANSETRON 4 MG PO TBDP
ORAL_TABLET | ORAL | Status: AC
  Filled 2021-10-09: qty 1

## 2021-10-09 NOTE — Discharge Instructions (Signed)
You were seen today for fever, nausea and vomiting.  ?Your strep test and flu test was negative.  Your chest xray did not show pneumonia.  ?Your covid test is pending and will be resulted tomorrow.  We will call you if positive and start treatment if needed.  ?In the mean time, I recommend getting plenty of rest and fluids.  I have sent out a medication to help with the nausea and vomiting.  ?If you are unable to keep anything down, and you start to feel dehydrated, then please go to the ER for IV fluids if needed.  You can take tylenol for any fever, body aches or sore throat as well.  ?

## 2021-10-09 NOTE — ED Provider Notes (Signed)
?[facility] ? ? ? ?CSN: [id number] ?Arrival date & time: 10/09/21  1052 ? ? ?  ? ?History   ?Chief Complaint ?Chief Complaint  ?Patient presents with  ? Fever  ? Sore Throat  ? ? ?HPI ?Jasmine Vaughan is a 81 y.o. female.  ? ?Patient is here for 2 days of cough, sore throat, and fever.  ?She started vomiting over night and into today.   ?No stomach pain noted.  ?No diarrhea.  ?She did not try to eat/drink anything today.  ?She was at a funeral on Wednesday, but no known sick contacts.  ?She took 2 tylenol on Wednesday, no other meds taken.  ? ?History reviewed. No pertinent past medical history. ? ?There are no problems to display for this patient. ? ? ?Past Surgical History:  ?Procedure Laterality Date  ? BREAST SURGERY    ? ? ?OB History   ?No obstetric history on file. ?  ? ? ? ?Home Medications   ? ?Prior to Admission medications   ?Medication Sig Start Date End Date Taking? Authorizing Provider  ?acetaminophen (TYLENOL) 325 MG tablet Take 650 mg by mouth every 6 (six) hours as needed.    [provider]  ?HYDROcodone-acetaminophen (NORCO/VICODIN) 5-325 MG tablet Take 1 tablet by mouth every 6 (six) hours as needed for moderate pain.    [provider]  ?MELOXICAM PO Take by mouth.    [provider]  ?Menthol, Topical Analgesic, (BENGAY EX) Apply topically.    [provider]  ?Menthol, Topical Analgesic, (BIOFREEZE EX) Apply topically.    [provider]  ?triamcinolone cream (KENALOG) 0.1 % Apply 1 application topically 2 (two) times daily. ?Patient not taking: Reported on 01/09/2018 07/31/17   Ofilia Neas, PA-C  ? ? ?Family History ?Family History  ?Family history unknown: Yes  ? ? ?Social History ?Social History  ? ?Tobacco Use  ? Smoking status: Never  ? Smokeless tobacco: Never  ?Vaping Use  ? Vaping Use: Never used  ?Substance Use Topics  ? Alcohol use: No  ? Drug use: No  ? ? ? ?Allergies   ?Bee venom, Other, Yellow jacket venom, and Wasp  venom ? ? ?Review of Systems ?Review of Systems  ?Constitutional:  Positive for fever.  ?HENT:  Positive for sore throat. Negative for congestion and sinus pain.   ?Eyes: Negative.   ?Respiratory:  Positive for cough. Negative for shortness of breath and wheezing.   ?Cardiovascular: Negative.   ?Gastrointestinal:  Positive for nausea and vomiting. Negative for abdominal pain and diarrhea.  ?Genitourinary: Negative.   ?Musculoskeletal: Negative.   ?Skin: Negative.   ? ? ?Physical Exam ?Triage Vital Signs ?ED Triage Vitals  ?Enc Vitals Group  ?   BP 10/09/21 1151 (!) 160/101  ?   Pulse Rate 10/09/21 1151 (!) 106  ?   Resp 10/09/21 1151 20  ?   Temp 10/09/21 1151 (!) 101.4 ?F (38.6 ?C)  ?   Temp Source 10/09/21 1151 Oral  ?   SpO2 10/09/21 1151 100 %  ?   Weight --   ?   Height --   ?   Head Circumference --   ?   Peak Flow --   ?   Pain Score 10/09/21 1152 5  ?   Pain Loc --   ?   Pain Edu? --   ?   Excl. in GC? --   ? ?No data found. ? ?Updated Vital Signs ?BP (!) 160/101 (BP Location:  Left Arm)   Pulse (!) 106   Temp (!) 101.4 ?F (38.6 ?C) (Oral)   Resp 20   SpO2 100%  ? ?Visual Acuity ?Right Eye Distance:   ?Left Eye Distance:   ?Bilateral Distance:   ? ?Right Eye Near:   ?Left Eye Near:    ?Bilateral Near:    ? ?Physical Exam ?Constitutional:   ?   General: She is not in acute distress. ?   Appearance: Normal appearance. She is ill-appearing. She is not toxic-appearing.  ?HENT:  ?   Right Ear: Tympanic membrane normal.  ?   Left Ear: Tympanic membrane normal.  ?   Nose: Nose normal. No congestion or rhinorrhea.  ?   Mouth/Throat:  ?   Mouth: Mucous membranes are moist.  ?   Pharynx: Posterior oropharyngeal erythema present. No oropharyngeal exudate.  ?Cardiovascular:  ?   Rate and Rhythm: Normal rate and regular rhythm.  ?Pulmonary:  ?   Effort: Pulmonary effort is normal.  ?   Breath sounds: Normal breath sounds.  ?Abdominal:  ?   General: Bowel sounds are normal.  ?   Palpations: Abdomen is soft.  ?    Tenderness: There is no abdominal tenderness.  ?Musculoskeletal:  ?   Cervical back: Normal range of motion and neck supple. No tenderness.  ?Skin: ?   General: Skin is warm.  ?Neurological:  ?   Mental Status: She is alert and oriented to person, place, and time.  ?Psychiatric:     ?   Mood and Affect: Mood normal.     ?   Behavior: Behavior normal.  ? ? ? ?UC Treatments / Results  ?Labs ?(all labs ordered are listed, but only abnormal results are displayed) ?Labs Reviewed  ?SARS CORONAVIRUS 2 (TAT 6-24 HRS)  ?CULTURE, GROUP A STREP [facility])  ?POC INFLUENZA A AND B ANTIGEN (URGENT CARE ONLY)  ?POCT RAPID STREP A, ED / UC  ? ? ?EKG ? ? ?Radiology ?DG Chest 2 View ? ?Result Date: 10/09/2021 ?CLINICAL DATA:  Cough, sore throat, and fever since Wednesday night, weakness EXAM: CHEST - 2 VIEW COMPARISON:  None FINDINGS: Normal heart size, mediastinal contours, and pulmonary vascularity. New line lungs clear. Mild central peribronchial thickening noted. No acute infiltrate, pleural effusion, or pneumothorax. Biconvex thoracic scoliosis. IMPRESSION: Bronchitic changes without infiltrate. Electronically Signed   By: Ulyses SouthwardMark  Boles M.D.   On: 10/09/2021 12:41   ? ?Procedures ?Procedures (including critical care time) ? ?Medications Ordered in UC ?Medications - No data to display ? ?Initial Impression / Assessment and Plan / UC Course  ?I have reviewed the triage vital signs and the nursing notes. ? ?Pertinent labs & imaging results that were available during my care of the patient were reviewed by me and considered in my medical decision making (see chart for details). ? ?Patient was seen today primarily for fever, nausea and vomiting.  ?Xray, strep, flu negative.  Xray was negative for pneumonia or infectious process.  ?Covid pending ?She did not appear dehydrated at this time.  ?I have sent out zofran for n/v, and advised her to push fluids. ?If covid is positive, then would start treatment with molnupiravir.  ?  ?Final Clinical  Impressions(s) / UC Diagnoses  ? ?Final diagnoses:  ?Fever, unspecified  ?Nausea and vomiting, unspecified vomiting type  ?Acute cough  ?Sore throat  ? ? ? ?Discharge Instructions   ? ?  ?You were seen today for fever, nausea and vomiting.  ?Your strep test and  flu test was negative.  Your chest xray did not show pneumonia.  ?Your covid test is pending and will be resulted tomorrow.  We will call you if positive and start treatment if needed.  ?In the mean time, I recommend getting plenty of rest and fluids.  I have sent out a medication to help with the nausea and vomiting.  ?If you are unable to keep anything down, and you start to feel dehydrated, then please go to the ER for IV fluids if needed.  You can take tylenol for any fever, body aches or sore throat as well.  ? ? ? ?ED Prescriptions   ? ? Medication Sig Dispense Auth. Provider  ? ondansetron (ZOFRAN) 4 MG tablet Take 1 tablet (4 mg total) by mouth every 6 (six) hours. 12 tablet Jannifer Franklin, MD  ? ?  ? ?PDMP not reviewed this encounter. ?  Jannifer Franklin, MD ?10/09/21 1313 ? ?

## 2021-10-09 NOTE — ED Triage Notes (Signed)
Pt c/o cough, sore throat, fever since Wednesday night. States feeling weak now and vomit x1 in waiting area. Pt a/o x4. ?

## 2021-10-11 LAB — CULTURE, GROUP A STREP (THRC)

## 2021-10-12 ENCOUNTER — Telehealth (HOSPITAL_COMMUNITY): Payer: Self-pay

## 2021-10-12 MED ORDER — MOLNUPIRAVIR EUA 200MG CAPSULE: 4.0000 | ORAL_CAPSULE | Freq: Two times a day (BID) | ORAL | 0 refills | Status: AC

## 2022-01-13 ENCOUNTER — Encounter (HOSPITAL_COMMUNITY): Payer: Self-pay

## 2022-01-13 ENCOUNTER — Ambulatory Visit (HOSPITAL_COMMUNITY)
Admission: EM | Admit: 2022-01-13 | Discharge: 2022-01-13 | Disposition: A | Discharge status: Home / Self Care | Payer: Medicare Other | Attending: Emergency Medicine | Admitting: Emergency Medicine

## 2022-01-13 DIAGNOSIS — T63441A Toxic effect of venom of bees, accidental (unintentional), initial encounter: Secondary | ICD-10-CM

## 2022-01-13 MED ORDER — METHYLPREDNISOLONE SODIUM SUCC 125 MG IJ SOLR
125.0000 mg | Freq: Once | INTRAMUSCULAR | Status: AC
  Administered 2022-01-13: 125 mg via INTRAMUSCULAR

## 2022-01-13 MED ORDER — METHYLPREDNISOLONE SODIUM SUCC 125 MG IJ SOLR
INTRAMUSCULAR | Status: AC
  Filled 2022-01-13: qty 2

## 2022-01-13 NOTE — Discharge Instructions (Addendum)
Steroid injection given today to help reduce pain and swelling. Take prednisone as prescribed starting tomorrow morning. Recommend also taking Benadryl and can apply cold compress to the area as well. Go to the ER or call 911 if develop any difficulty breathing.

## 2022-01-13 NOTE — ED Triage Notes (Signed)
Pt states stung by a bee yesterday to her forehead and today having swelling to lt eye. States using all different remedies with no relief.

## 2022-01-13 NOTE — ED Provider Notes (Signed)
[facility]    CSN: [id number] Arrival date & time: 01/13/22  1304      History   Chief Complaint Chief Complaint  Patient presents with   Insect Bite    HPI Jasmine Vaughan is a 81 y.o. female.   Patient presents with concerns of bee sting to the left forehead. She states yesterday afternoon she was dropping off canned goods at [employer] and when she put the bag into the box a bunch of bees flew out. She states she was stung at least once on the left forehead. The patient has tried various home remedies but not taken any medicine such as Benadryl for it. She states the area is swollen and today she has developed more swelling above and around her left eye which causes her difficulty opening that eye fully. She reports prior similar reaction to stings. She denies any difficulty breathing, dizziness, throat or tongue swelling, difficulty swallowing, or any vision changes aside from the swollen eyelid.   The history is provided by the patient.    History reviewed. No pertinent past medical history.  There are no problems to display for this patient.   Past Surgical History:  Procedure Laterality Date   BREAST SURGERY      OB History   No obstetric history on file.      Home Medications    Prior to Admission medications   Medication Sig Start Date End Date Taking? Authorizing Provider  acetaminophen (TYLENOL) 325 MG tablet Take 650 mg by mouth every 6 (six) hours as needed.    [provider]  HYDROcodone-acetaminophen (NORCO/VICODIN) 5-325 MG tablet Take 1 tablet by mouth every 6 (six) hours as needed for moderate pain.    [provider]  MELOXICAM PO Take by mouth.    [provider]  Menthol, Topical Analgesic, (BENGAY EX) Apply topically.    [provider]  Menthol, Topical Analgesic, (BIOFREEZE EX) Apply topically.    [provider]  ondansetron (ZOFRAN) 4 MG tablet Take 1 tablet (4 mg total) by mouth  every 6 (six) hours. 10/09/21   Piontek, Junie Panning, MD  triamcinolone cream (KENALOG) 0.1 % Apply 1 application topically 2 (two) times daily. Patient not taking: Reported on 01/09/2018 07/31/17   Tereasa Coop, PA-C    Family History Family History  Family history unknown: Yes    Social History Social History   Tobacco Use   Smoking status: Never   Smokeless tobacco: Never  Vaping Use   Vaping Use: Never used  Substance Use Topics   Alcohol use: No   Drug use: No     Allergies   Bee venom, Other, Yellow jacket venom, and Wasp venom   Review of Systems Review of Systems  Constitutional:  Negative for fatigue and fever.  HENT:  Positive for facial swelling. Negative for trouble swallowing and voice change.   Eyes:  Negative for photophobia and visual disturbance.  Respiratory:  Negative for chest tightness, shortness of breath and wheezing.   Cardiovascular:  Negative for chest pain.  Gastrointestinal:  Negative for nausea and vomiting.  Musculoskeletal:  Negative for myalgias.  Skin:  Negative for rash.  Neurological:  Negative for dizziness, syncope, weakness, light-headedness and headaches.     Physical Exam Triage Vital Signs ED Triage Vitals  Enc Vitals Group     BP 01/13/22 1442 (!) 221/100     Pulse Rate 01/13/22 1442 84     Resp 01/13/22 1442 18  Temp --      Temp Source 01/13/22 1442 Oral     SpO2 01/13/22 1442 97 %     Weight --      Height --      Head Circumference --      Peak Flow --      Pain Score 01/13/22 1443 0     Pain Loc --      Pain Edu? --      Excl. in GC? --    No data found.  Updated Vital Signs BP (!) 132/92 (BP Location: Left Arm)   Pulse 84   Resp 18   SpO2 98%   Visual Acuity Right Eye Distance:   Left Eye Distance:   Bilateral Distance:    Right Eye Near:   Left Eye Near:    Bilateral Near:     Physical Exam Vitals and nursing note reviewed.  Constitutional:      General: She is not in acute distress. HENT:      Head:     Comments: Induration and mild swelling with mild erythema to left forehead with mild tenderness. Small lesion consistent with sting site. Swelling without erythema to left upper and lower eyelids and upper cheek. No tenderness around eye - appears to be dependent swelling.     Mouth/Throat:     Mouth: Mucous membranes are moist.     Pharynx: Oropharynx is clear. Uvula midline. No pharyngeal swelling or uvula swelling.  Eyes:     Extraocular Movements: Extraocular movements intact.     Conjunctiva/sclera: Conjunctivae normal.     Pupils: Pupils are equal, round, and reactive to light.  Cardiovascular:     Rate and Rhythm: Normal rate and regular rhythm.     Heart sounds: Normal heart sounds.  Pulmonary:     Effort: Pulmonary effort is normal.     Breath sounds: Normal breath sounds.  Musculoskeletal:     Cervical back: Normal range of motion.  Lymphadenopathy:     Cervical: No cervical adenopathy.  Neurological:     Mental Status: She is alert.  Psychiatric:        Mood and Affect: Mood normal.      UC Treatments / Results  Labs (all labs ordered are listed, but only abnormal results are displayed) Labs Reviewed - No data to display  EKG   Radiology No results found.  Procedures Procedures (including critical care time)  Medications Ordered in UC Medications  methylPREDNISolone sodium succinate (SOLU-MEDROL) 125 mg/2 mL injection 125 mg (has no administration in time range)    Initial Impression / Assessment and Plan / UC Course  I have reviewed the triage vital signs and the nursing notes.  Pertinent labs & imaging results that were available during my care of the patient were reviewed by me and considered in my medical decision making (see chart for details).     Bee sting with moderate reaction, no signs of angioedema or anaphylaxis. Steroid IM and PO along with encouraging Benadryl. ER precautions discussed.   E/M: 1 acute uncomplicated  illness, no data, moderate risk due to prescription management  Final Clinical Impressions(s) / UC Diagnoses   Final diagnoses:  Bee sting, accidental or unintentional, initial encounter  Elevated blood pressure reading     Discharge Instructions      Steroid injection given today to help reduce pain and swelling. Take prednisone as prescribed starting tomorrow morning. Recommend also taking Benadryl and can apply cold compress to the  area as well. Go to the ER or call 911 if develop any difficulty breathing.   Your blood pressure was high today. Recommend follow-up with PCP for further evaluation and management. Go to the ER if develop severe headache, vision changes, or chest pain.     ED Prescriptions   None    PDMP not reviewed this encounter.   Estanislado Pandy, [place] 01/13/22 [phone]

## 2022-01-15 ENCOUNTER — Telehealth (HOSPITAL_COMMUNITY): Payer: Self-pay | Admitting: Internal Medicine

## 2022-01-15 MED ORDER — PREDNISONE 20 MG PO TABS: 20.0000 mg | ORAL_TABLET | Freq: Every day | ORAL | 0 refills | Status: AC

## 2022-01-15 NOTE — Telephone Encounter (Signed)
Patient return to urgent care to obtain prescription for oral prednisone.  She was seen on January 13, 2022 for a bee sting (2 days ago) and given a steroid injection in the clinic.  Provider at that time intended to send in an oral prednisone prescription but forgot to do so.  Patient's left eye remains mildly swollen.  Vision is grossly intact.  She is also reporting a mild headache to the left frontal area of her forehead.  No shortness of breath, chest pain, chest tightness, or scratchy throat reported.  She has continued taking Benadryl as recommended and states that her symptoms have significantly improved since her initial urgent care visit.  She continues to have itching to the superior aspect of the left eyebrow and generalized eye where she was stung by the bee.  I will go ahead and prescribe 20 mg prednisone once daily for the next 5 days.  She may begin taking this either tonight or tomorrow morning with food.  Emphasized importance of taking this medication with food and avoiding NSAID medications while taking steroids to lower risk for GI bleeding/upset.  Patient verbalizes understanding and agreement with plan. Follow-up with UC as needed.

## 2022-09-25 ENCOUNTER — Encounter (HOSPITAL_COMMUNITY): Payer: Self-pay

## 2022-09-25 ENCOUNTER — Ambulatory Visit (HOSPITAL_COMMUNITY)
Admission: EM | Admit: 2022-09-25 | Discharge: 2022-09-25 | Disposition: A | Discharge status: Home / Self Care | Payer: 59

## 2022-09-25 DIAGNOSIS — J069 Acute upper respiratory infection, unspecified: Secondary | ICD-10-CM

## 2022-09-25 DIAGNOSIS — S161XXA Strain of muscle, fascia and tendon at neck level, initial encounter: Secondary | ICD-10-CM

## 2022-09-25 HISTORY — DX: Unspecified osteoarthritis, unspecified site: M19.90

## 2022-09-25 HISTORY — DX: Essential (primary) hypertension: I10

## 2022-09-25 MED ORDER — LIDOCAINE 5 % EX PTCH: 1.0000 | MEDICATED_PATCH | CUTANEOUS | 0 refills | Status: DC

## 2022-09-25 MED ORDER — BENZONATATE 100 MG PO CAPS: 100.0000 mg | ORAL_CAPSULE | Freq: Three times a day (TID) | ORAL | 0 refills | Status: DC

## 2022-09-25 MED ORDER — GUAIFENESIN ER 600 MG PO TB12: 1200.0000 mg | ORAL_TABLET | Freq: Two times a day (BID) | ORAL | 0 refills | Status: AC

## 2022-09-25 NOTE — ED Provider Notes (Signed)
[facility]    CSN: [id number] Arrival date & time: 09/25/22  1007      History   Chief Complaint Chief Complaint  Patient presents with   Cough   Sore Throat    HPI Jasmine Vaughan is a 82 y.o. female.    Reports sore throat, right sided neck soreness and a non-productive cough for the past three days.  She reports she has been coughing so hard she has lost her voice.  Last night she felt like she had to sleep on a few pillows due to the feeling of phlegm in her throat and shortness of breath.  Denies current shortness of breath, chest pain or wheezing.  Denies headache or vision changes.  She did take a Tylenol and it helped her symptoms temporarily.  For her neck pain she denies any falls or trauma.  Reports her neck is stiff and driving here was difficult backing into the parking spot.  She did take a home COVID 19 test that was negative.  Denies fever, abdominal pain, nausea, vomiting or diarrhea.     The history is provided by the patient and medical records.  Cough Associated symptoms: sore throat   Associated symptoms: no chest pain, no chills, no ear pain, no fever, no rash and no shortness of breath   Sore Throat Pertinent negatives include no chest pain, no abdominal pain and no shortness of breath.    Past Medical History:  Diagnosis Date   Arthritis    Hypertension     There are no problems to display for this patient.   Past Surgical History:  Procedure Laterality Date   BREAST SURGERY      OB History   No obstetric history on file.      Home Medications    Prior to Admission medications   Medication Sig Start Date End Date Taking? Authorizing Provider  acetaminophen (TYLENOL) 325 MG tablet Take 650 mg by mouth every 6 (six) hours as needed.   Yes [provider]  benzonatate (TESSALON) 100 MG capsule Take 1 capsule (100 mg total) by mouth every 8 (eight) hours. 09/25/22  Yes Rinaldo Ratel, [place] N, FNP  guaiFENesin  (MUCINEX) 600 MG 12 hr tablet Take 2 tablets (1,200 mg total) by mouth 2 (two) times daily for 5 days. 09/25/22 09/30/22 Yes Rinaldo Ratel, [place] N, FNP  hydrochlorothiazide (HYDRODIURIL) 12.5 MG tablet Take 12.5 mg by mouth daily. 08/19/22  Yes [provider]  lidocaine (LIDODERM) 5 % Place 1 patch onto the skin daily. Remove & Discard patch within 12 hours or as directed by MD 09/25/22  Yes Rinaldo Ratel, [place], FNP  MELOXICAM PO Take by mouth.   Yes [provider]  oxybutynin (DITROPAN-XL) 10 MG 24 hr tablet Take 10 mg by mouth daily. 08/19/22  Yes [provider]  pravastatin (PRAVACHOL) 20 MG tablet Take 20 mg by mouth daily. 08/19/22  Yes [provider]  triamcinolone cream (KENALOG) 0.1 % Apply 1 application topically 2 (two) times daily. 07/31/17  Yes Ofilia Neas, PA-C    Family History Family History  Family history unknown: Yes    Social History Social History   Tobacco Use   Smoking status: Never   Smokeless tobacco: Never  Vaping Use   Vaping Use: Never used  Substance Use Topics   Alcohol use: No   Drug use: No     Allergies   Bee venom, Other, Yellow jacket venom, and Wasp venom   Review of  Systems Review of Systems  Constitutional:  Negative for chills and fever.  HENT:  Positive for sore throat. Negative for ear pain.   Eyes:  Negative for pain and visual disturbance.  Respiratory:  Positive for cough. Negative for shortness of breath.   Cardiovascular:  Negative for chest pain and palpitations.  Gastrointestinal:  Negative for abdominal pain and vomiting.  Genitourinary:  Negative for dysuria and hematuria.  Musculoskeletal:  Negative for arthralgias and back pain.  Skin:  Negative for color change and rash.  Neurological:  Negative for seizures and syncope.  All other systems reviewed and are negative.    Physical Exam Triage Vital Signs ED Triage Vitals  Enc Vitals Group     BP 09/25/22 1054 (!) 168/84     Pulse  Rate 09/25/22 1054 85     Resp 09/25/22 1054 18     Temp 09/25/22 1054 98.4 F (36.9 C)     Temp Source 09/25/22 1054 Oral     SpO2 09/25/22 1054 97 %     Weight --      Height 09/25/22 1053 5' 4.5" (1.638 m)     Head Circumference --      Peak Flow --      Pain Score 09/25/22 1050 5     Pain Loc --      Pain Edu? --      Excl. in GC? --    No data found.  Updated Vital Signs BP (!) 168/84 (BP Location: Right Arm)   Pulse 85   Temp 98.4 F (36.9 C) (Oral)   Resp 18   Ht 5' 4.5" (1.638 m)   SpO2 97%   Visual Acuity Right Eye Distance:   Left Eye Distance:   Bilateral Distance:    Right Eye Near:   Left Eye Near:    Bilateral Near:     Physical Exam Vitals and nursing note reviewed.  Constitutional:      General: She is not in acute distress.    Appearance: She is well-developed.  HENT:     Head: Normocephalic and atraumatic.     Nose: Congestion and rhinorrhea present.     Mouth/Throat:     Mouth: Mucous membranes are moist. No oral lesions.     Pharynx: Uvula midline. Posterior oropharyngeal erythema present. No pharyngeal swelling, oropharyngeal exudate or uvula swelling.     Tonsils: No tonsillar exudate or tonsillar abscesses.  Eyes:     Conjunctiva/sclera: Conjunctivae normal.  Cardiovascular:     Rate and Rhythm: Normal rate and regular rhythm.     Heart sounds: Normal heart sounds. No murmur heard. Pulmonary:     Effort: Pulmonary effort is normal. No respiratory distress.     Breath sounds: Normal breath sounds.  Musculoskeletal:        General: No swelling.     Cervical back: Normal range of motion and neck supple.  Lymphadenopathy:     Cervical: No cervical adenopathy.  Skin:    General: Skin is warm and dry.     Capillary Refill: Capillary refill takes less than 2 seconds.  Neurological:     Mental Status: She is alert and oriented to person, place, and time.  Psychiatric:        Mood and Affect: Mood normal.      UC Treatments /  Results  Labs (all labs ordered are listed, but only abnormal results are displayed) Labs Reviewed - No data to display  EKG   Radiology  No results found.  Procedures Procedures (including critical care time)  Medications Ordered in UC Medications - No data to display  Initial Impression / Assessment and Plan / UC Course  I have reviewed the triage vital signs and the nursing notes.  Pertinent labs & imaging results that were available during my care of the patient were reviewed by me and considered in my medical decision making (see chart for details).  Vitals in triage reviewed, patient is hemodynamically stable.  Patient is hypertensive, reports compliance with her antihypertensive medication.  On physical exam she has posterior pharynx erythema, hoarseness and a nonproductive cough.  Lungs vesicular posteriorly, low concern for pneumonia or bacterial etiology at this point during infection.  Right-sided neck pain not reproducible to palpation, patient reports stiffness and pain with range of motion, suspect cervical strain.  Advised lidocaine patches and heat.  Symptomatic management discussed for viral upper respiratory infection.  Return and follow-up precautions reviewed, patient verbalized understanding, no questions at this time.     Final Clinical Impressions(s) / UC Diagnoses   Final diagnoses:  Viral URI with cough  Strain of neck muscle, initial encounter     Discharge Instructions      Your symptoms are consistent with a viral upper respiratory infection today in clinic.  You can take 500 mg of Tylenol as needed for your aches and pains. Please sleep with a humidifier, do tea with honey, warm saline gargles and the mucinex to help loosen your secretions. Ensure you are drinking at least 64 ounces of water daily as well.   For your neck pain please do a warm compress with a heating pad or warm rag.  You can do gentle stretching and the lidocaine patches as  well.  Please return to clinic or seek immediate care if you develop worsening shortness of breath, wheezing, fever or no improvement over the next 7 days.      ED Prescriptions     Medication Sig Dispense Auth. Provider   guaiFENesin (MUCINEX) 600 MG 12 hr tablet Take 2 tablets (1,200 mg total) by mouth 2 (two) times daily for 5 days. 20 tablet Rinaldo Ratel, [place], FNP   lidocaine (LIDODERM) 5 % Place 1 patch onto the skin daily. Remove & Discard patch within 12 hours or as directed by MD 30 patch Rinaldo Ratel, [place], FNP   benzonatate (TESSALON) 100 MG capsule Take 1 capsule (100 mg total) by mouth every 8 (eight) hours. 21 capsule Ermelinda Eckert, [place] N, [place]      PDMP not reviewed this encounter.   Louella Medaglia, [place] N, [place] 09/25/22 1125

## 2022-09-25 NOTE — ED Triage Notes (Signed)
sore throat, ear pain x 3 days. Patient also coughing, voice loss, and outer neck pain.

## 2022-09-25 NOTE — Discharge Instructions (Addendum)
Your symptoms are consistent with a viral upper respiratory infection today in clinic.  You can take 500 mg of Tylenol as needed for your aches and pains. Please sleep with a humidifier, do tea with honey, warm saline gargles and the mucinex to help loosen your secretions. Ensure you are drinking at least 64 ounces of water daily as well.   For your neck pain please do a warm compress with a heating pad or warm rag.  You can do gentle stretching and the lidocaine patches as well.  Please return to clinic or seek immediate care if you develop worsening shortness of breath, wheezing, fever or no improvement over the next 7 days.

## 2022-10-27 IMAGING — DX DG CHEST 2V
2 series · 2 of 2 positions shown · non-contrast
Comparison: None

CLINICAL DATA: Cough, sore throat, and fever since [REDACTED] night,
weakness

EXAM:
CHEST - 2 VIEW

[chest lat]
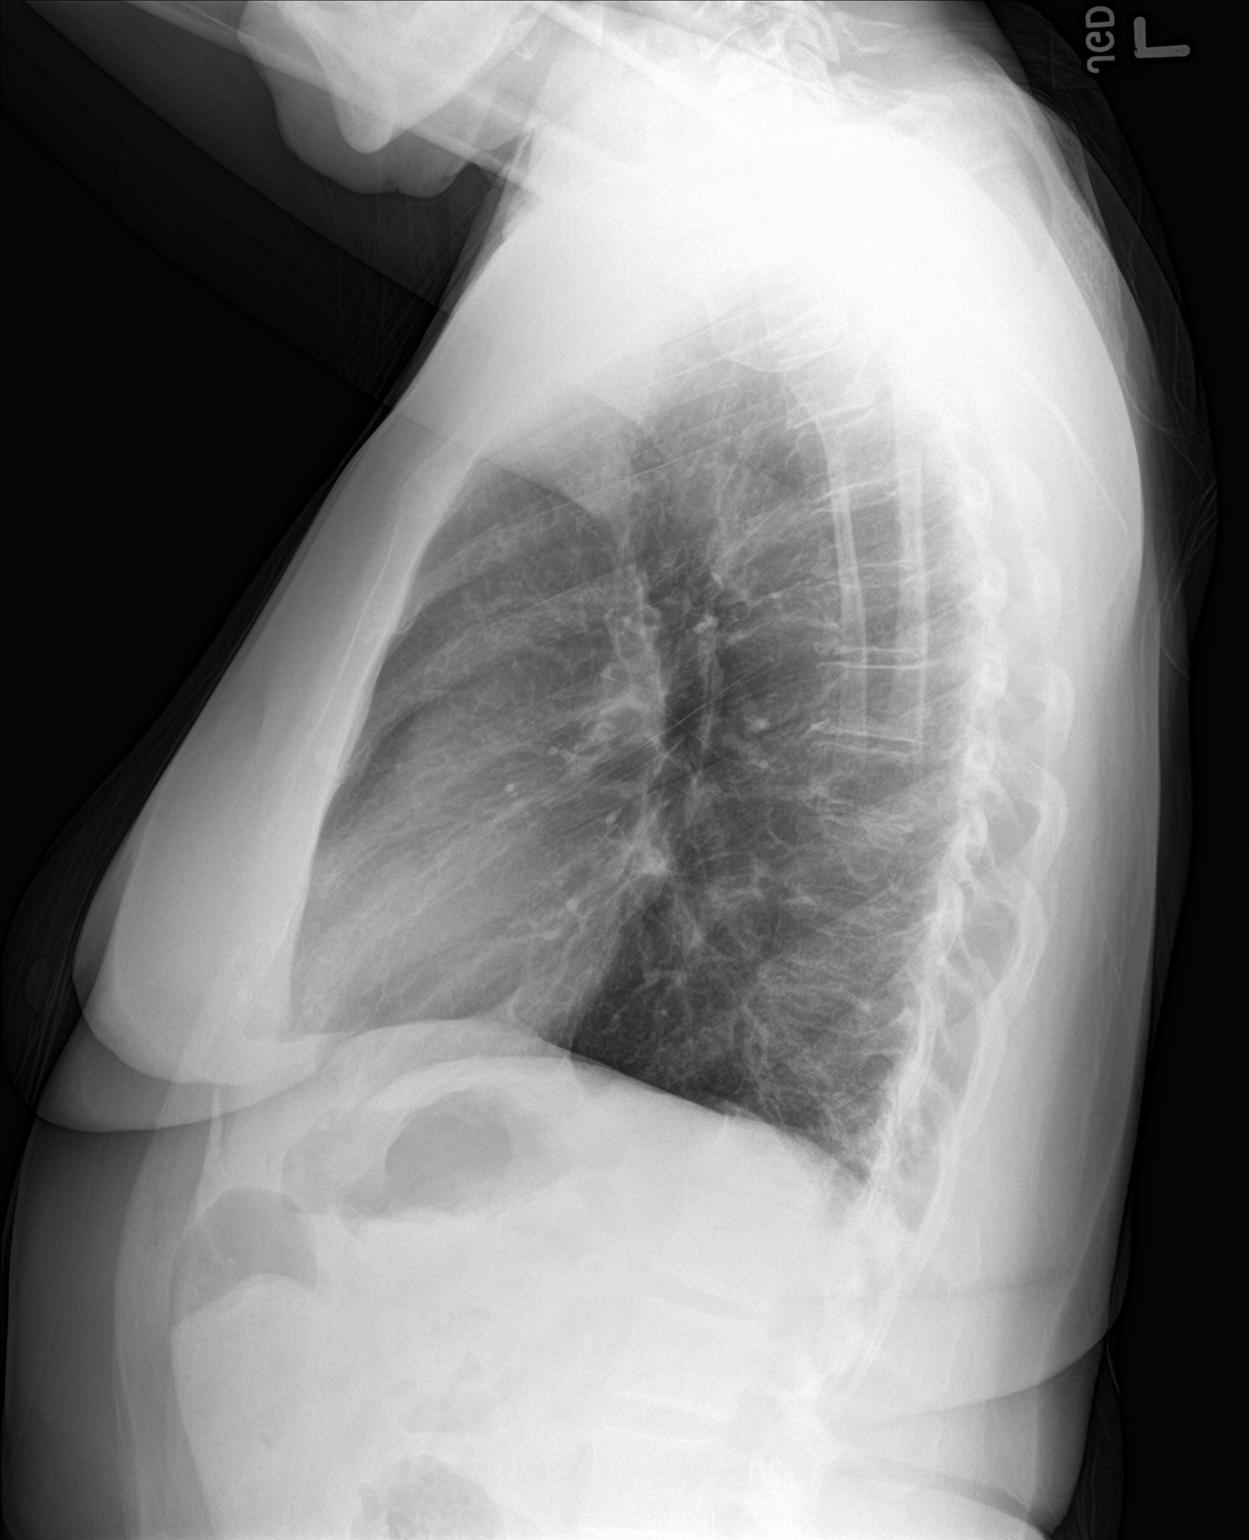

[chest pa]
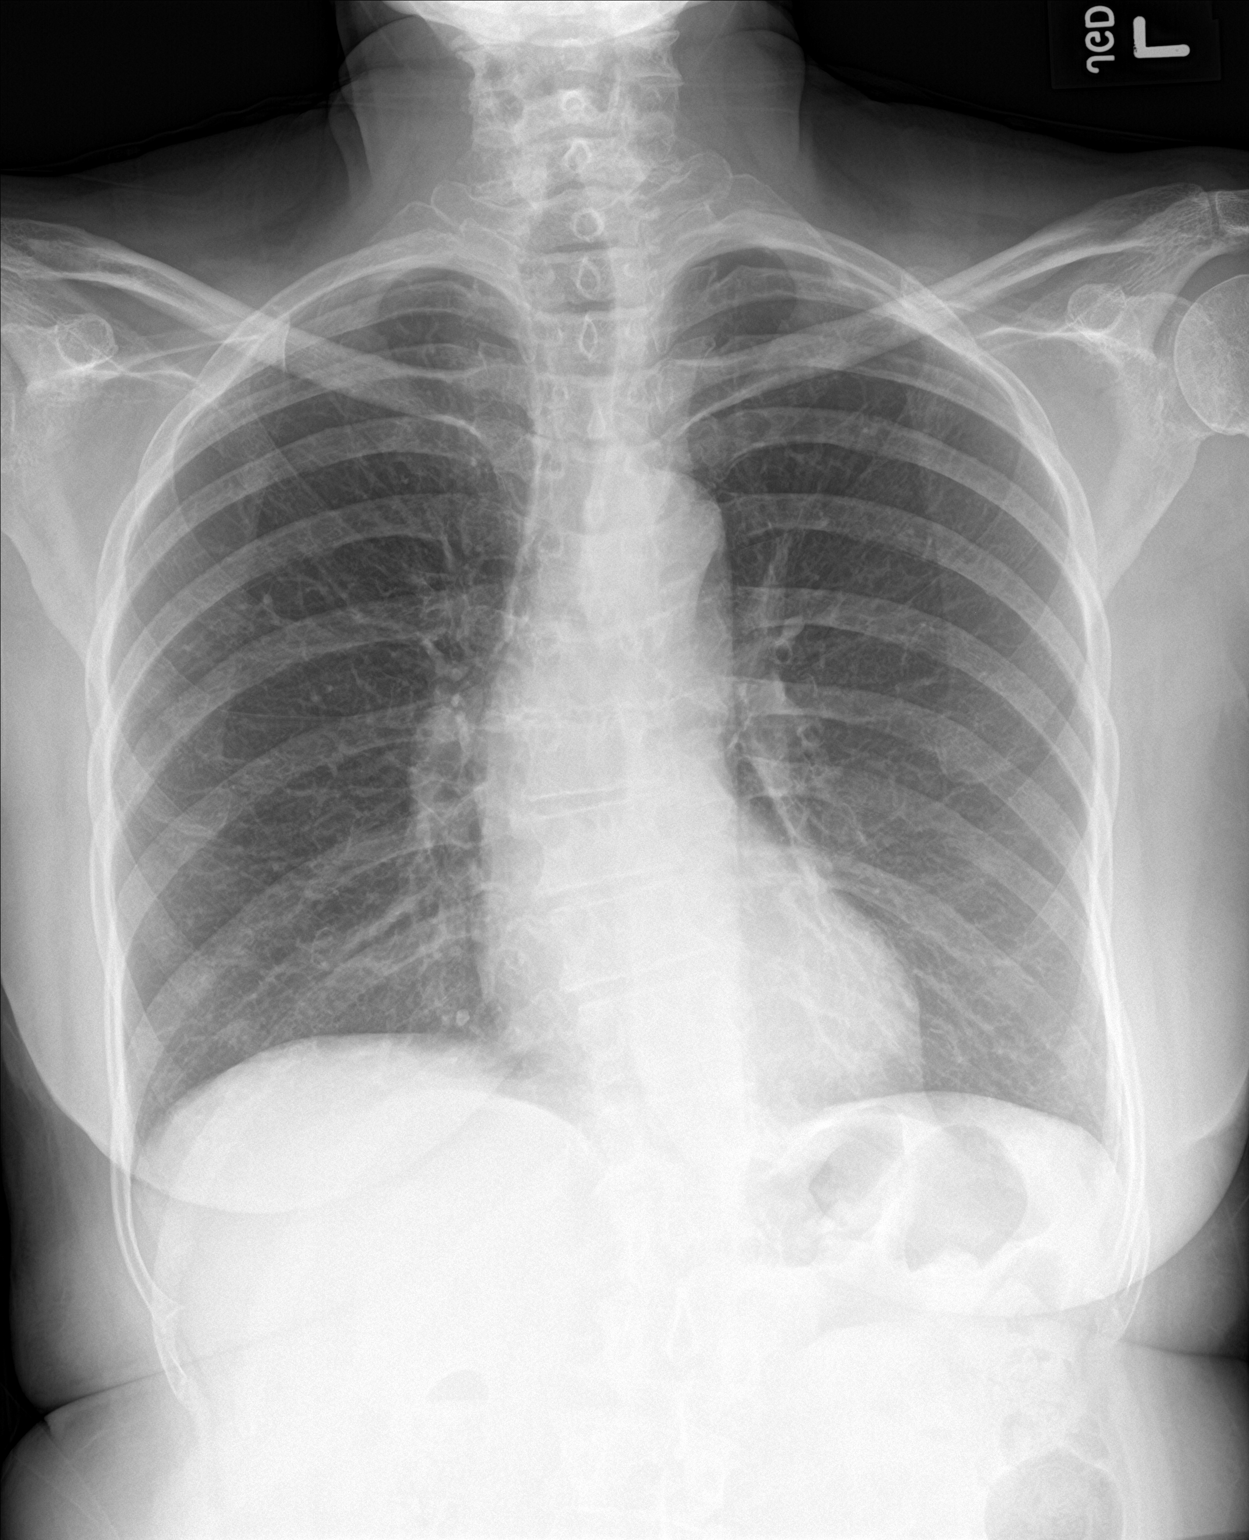

[2 of 2 positions shown; findings below may reference images not displayed]

FINDINGS: Normal heart size, mediastinal contours, and pulmonary vascularity.
New line lungs clear.

Mild central peribronchial thickening noted.

No acute infiltrate, pleural effusion, or pneumothorax.

Biconvex thoracic scoliosis.
IMPRESSION: Bronchitic changes without infiltrate.

## 2022-12-10 ENCOUNTER — Encounter (HOSPITAL_BASED_OUTPATIENT_CLINIC_OR_DEPARTMENT_OTHER): Payer: Self-pay

## 2022-12-10 ENCOUNTER — Other Ambulatory Visit: Payer: Self-pay

## 2022-12-10 ENCOUNTER — Emergency Department (HOSPITAL_BASED_OUTPATIENT_CLINIC_OR_DEPARTMENT_OTHER)
Admission: EM | Admit: 2022-12-10 | Discharge: 2022-12-10 | Disposition: A | Discharge status: Home / Self Care | Payer: 59 | Attending: Emergency Medicine | Admitting: Emergency Medicine

## 2022-12-10 DIAGNOSIS — I10 Essential (primary) hypertension: Secondary | ICD-10-CM | POA: Diagnosis present

## 2022-12-10 DIAGNOSIS — Z79899 Other long term (current) drug therapy: Secondary | ICD-10-CM | POA: Diagnosis not present

## 2022-12-10 NOTE — ED Triage Notes (Signed)
Patient here POV from Home.  Endorses being without BP Medications for 2 Weeks. It has been increasing slowly and today it was 215/105. No Discernable Complaints. No SOB. No Pain. States she feels no discomfort unless she is outside in the heat.   NAD Noted during triage. A&Ox4. Gcs 15. Ambulatory.

## 2022-12-10 NOTE — Discharge Instructions (Signed)
As we discussed, your blood pressure is likely elevated due to not taking your prescribed blood pressure medication.  I recommend that you fill and take this prescription as prescribed every day.  Please monitor your readings at home every day and record them on the sheet provided.  Please call your primary doctor and schedule an appointment for follow-up at your earliest convenience.  You can bring your record sheet to this appointment to discuss if additional management of your blood pressure in the future is required.  I did offer basic screening labs given that it does appear it has been a while since you last had blood drawn which you have declined.  I do recommend that you have this blood work drawn at your primary care doctor's office.  Return if development of any new or worsening symptoms.

## 2022-12-10 NOTE — ED Provider Notes (Signed)
EMERGENCY DEPARTMENT AT [facility] Provider Note   CSN: [id number] Arrival date & time: 12/10/22  1210     History  Chief Complaint  Patient presents with   Hypertension    Jasmine Vaughan is a 82 y.o. female.  Patient with history of hypertension presents today with complaints of hypertension.  She states that 4 days ago she ran out of her prescribed hydrochlorothiazide.  She went to her nurse practitioner today to get a refill of her blood pressure medication and they saw that her blood pressure was elevated and sent her here for evaluation.  They refilled her medication prior to her leaving.  Patient denies any symptoms at this time specifically denies headaches, vision changes, chest pain, shortness of breath, nausea, vomiting, or abdominal pain.  The history is provided by the patient. No language interpreter was used.  Hypertension       Home Medications Prior to Admission medications   Medication Sig Start Date End Date Taking? Authorizing Provider  acetaminophen (TYLENOL) 325 MG tablet Take 650 mg by mouth every 6 (six) hours as needed.    [provider]  benzonatate (TESSALON) 100 MG capsule Take 1 capsule (100 mg total) by mouth every 8 (eight) hours. 09/25/22   Garrison, [place] N, FNP  hydrochlorothiazide (HYDRODIURIL) 12.5 MG tablet Take 12.5 mg by mouth daily. 08/19/22   [provider]  lidocaine (LIDODERM) 5 % Place 1 patch onto the skin daily. Remove & Discard patch within 12 hours or as directed by MD 09/25/22   Garrison, [place] N, FNP  MELOXICAM PO Take by mouth.    [provider]  oxybutynin (DITROPAN-XL) 10 MG 24 hr tablet Take 10 mg by mouth daily. 08/19/22   [provider]  pravastatin (PRAVACHOL) 20 MG tablet Take 20 mg by mouth daily. 08/19/22   [provider]  triamcinolone cream (KENALOG) 0.1 % Apply 1 application topically 2 (two) times daily. 07/31/17   Ofilia Neas, PA-C       Allergies    Bee venom, Other, Yellow jacket venom, and Wasp venom    Review of Systems   Review of Systems  All other systems reviewed and are negative.   Physical Exam Updated Vital Signs BP (!) 174/76 (BP Location: Right Arm)   Pulse 73   Temp 97.9 F (36.6 C) (Oral)   Resp 18   Ht 5\' 5"  (1.651 m)   Wt 70.3 kg   SpO2 96%   BMI 25.79 kg/m  Physical Exam Vitals and nursing note reviewed.  Constitutional:      General: She is not in acute distress.    Appearance: Normal appearance. She is normal weight. She is not ill-appearing, toxic-appearing or diaphoretic.  HENT:     Head: Normocephalic and atraumatic.  Cardiovascular:     Rate and Rhythm: Normal rate and regular rhythm.     Heart sounds: Normal heart sounds.  Pulmonary:     Effort: Pulmonary effort is normal. No respiratory distress.     Breath sounds: Normal breath sounds.  Abdominal:     General: Abdomen is flat.     Palpations: Abdomen is soft.     Tenderness: There is no abdominal tenderness.  Musculoskeletal:        General: Normal range of motion.     Cervical back: Normal range of motion.  Skin:    General: Skin is warm and dry.  Neurological:     General: No focal deficit present.  Mental Status: She is alert.  Psychiatric:        Mood and Affect: Mood normal.        Behavior: Behavior normal.     ED Results / Procedures / Treatments   Labs (all labs ordered are listed, but only abnormal results are displayed) Labs Reviewed - No data to display  EKG None  Radiology No results found.  Procedures Procedures    Medications Ordered in ED Medications - No data to display  ED Course/ Medical Decision Making/ A&P                             Medical Decision Making  Patient sent here from her primary care provider with concerns for high blood pressure.  Patient notes that she went to the appointment to get refills of her blood pressure medication that she has been out of for the  last 4 days.  She denies any symptoms and states that she is in good health.  Her blood pressure is notably 174/76, however she has previously stated has not had her blood pressure medication yet.  They did send in refills at her primary care office but she has not picked them up yet.  She does note that it has been a year or more since she last had blood work and therefore I did offer basic screening labs and to give her a dose of her medication while in the emergency department today and monitor for improvement, however she states that she has another doctor's appointment across town at 2 PM and therefore cannot stay for this.  Given that she is asymptomatic, no indication for emergent laboratory evaluation at this time.  I do recommend that she call her primary doctor and schedule an appointment for close follow-up and that she has labs drawn at this appointment.  Also recommend she fill and take her blood pressure medication and monitor it at home. Evaluation and diagnostic testing in the emergency department does not suggest an emergent condition requiring admission or immediate intervention beyond what has been performed at this time.  Plan for discharge with close PCP follow-up.  Patient is understanding and amenable with plan, educated on red flag symptoms that would prompt immediate return.  Patient discharged in stable condition.  Final Clinical Impression(s) / ED Diagnoses Final diagnoses:  Elevated blood pressure reading with diagnosis of hypertension    Rx / DC Orders ED Discharge Orders     None     An After Visit Summary was printed and given to the patient.     Silva Bandy, PA-C 12/10/22 1318    Elayne Snare K, DO 12/10/22 1531

## 2022-12-29 ENCOUNTER — Ambulatory Visit (INDEPENDENT_AMBULATORY_CARE_PROVIDER_SITE_OTHER): Payer: 59 | Admitting: Student

## 2022-12-29 ENCOUNTER — Other Ambulatory Visit: Payer: Self-pay

## 2022-12-29 ENCOUNTER — Encounter: Payer: Self-pay | Admitting: Student

## 2022-12-29 ENCOUNTER — Ambulatory Visit (HOSPITAL_COMMUNITY)
Admission: RE | Admit: 2022-12-29 | Discharge: 2022-12-29 | Disposition: A | Discharge status: Home / Self Care | Payer: 59 | Source: Ambulatory Visit | Attending: Internal Medicine | Admitting: Internal Medicine

## 2022-12-29 VITALS — BP 130/84 | HR 63 | Temp 99.1°F | Resp 12 | Ht 65.0 in | Wt 150.2 lb

## 2022-12-29 DIAGNOSIS — I10 Essential (primary) hypertension: Secondary | ICD-10-CM | POA: Insufficient documentation

## 2022-12-29 DIAGNOSIS — R9431 Abnormal electrocardiogram [ECG] [EKG]: Secondary | ICD-10-CM | POA: Insufficient documentation

## 2022-12-29 DIAGNOSIS — R3915 Urgency of urination: Secondary | ICD-10-CM

## 2022-12-29 DIAGNOSIS — I499 Cardiac arrhythmia, unspecified: Secondary | ICD-10-CM | POA: Diagnosis not present

## 2022-12-29 DIAGNOSIS — I491 Atrial premature depolarization: Secondary | ICD-10-CM | POA: Diagnosis not present

## 2022-12-29 DIAGNOSIS — E782 Mixed hyperlipidemia: Secondary | ICD-10-CM

## 2022-12-29 DIAGNOSIS — M1612 Unilateral primary osteoarthritis, left hip: Secondary | ICD-10-CM | POA: Insufficient documentation

## 2022-12-29 DIAGNOSIS — R009 Unspecified abnormalities of heart beat: Secondary | ICD-10-CM | POA: Diagnosis present

## 2022-12-29 DIAGNOSIS — M1990 Unspecified osteoarthritis, unspecified site: Secondary | ICD-10-CM | POA: Insufficient documentation

## 2022-12-29 NOTE — Assessment & Plan Note (Signed)
On oxybutynin. Symptoms well-controlled during the day but waking up at night with urgency. Recommend no fluids after dinnertime and taking oxybutynin before bedtime.

## 2022-12-29 NOTE — Patient Instructions (Addendum)
Measure blood pressure at home and keep log. Bring log and blood pressure machine to next clinic visit.  Remember to bring all of the medications that you take (including over the counter medications and supplements) with you to every clinic visit.  This after visit summary is an important review of tests, referrals, and medication changes that were discussed during your visit. If you have questions or concerns, call [phone]. Outside of clinic business hours, call the main hospital at [phone] and ask the operator for the on-call internal medicine resident.   Marrianne Mood MD 12/29/2022, 12:09 PM

## 2022-12-29 NOTE — Assessment & Plan Note (Addendum)
Incidentally discovered. Heart rate irregular on exam. EKG shows PACs. Asymptomatic. TSH today.

## 2022-12-29 NOTE — Progress Notes (Addendum)
Subjective:  Jasmine Vaughan is a 82 y.o. who presents to clinic for the following:  Establishing care. Hypertension.  Healthy 82 y.o. female concerned about blood pressure. On low-dose thiazide. BP is occasionally elevated, sometimes as high as 170s systolic.  Review of Systems  Constitutional:  Negative for chills, fever, malaise/fatigue and weight loss.  Respiratory:  Negative for cough and shortness of breath.   Cardiovascular:  Negative for chest pain, palpitations and leg swelling.  Gastrointestinal:  Negative for abdominal pain, blood in stool, heartburn and melena.  Genitourinary:  Positive for urgency.  Psychiatric/Behavioral:  Negative for depression. The patient is not nervous/anxious and does not have insomnia.    Medical history notable for hypertension, arthritis, and DCIS of right breast.  No medication allergies.  Good adherence to following medications:  Current Outpatient Medications on File Prior to Visit  Medication Sig Dispense Refill   acetaminophen (TYLENOL) 325 MG tablet Take 650 mg by mouth every 6 (six) hours as needed.     hydrochlorothiazide (HYDRODIURIL) 12.5 MG tablet Take 12.5 mg by mouth daily.     MELOXICAM PO Take by mouth.     oxybutynin (DITROPAN-XL) 10 MG 24 hr tablet Take 10 mg by mouth at bedtime.     pravastatin (PRAVACHOL) 20 MG tablet Take 20 mg by mouth daily.     No current facility-administered medications on file prior to visit.   Surgical history includes right partial mastectomy in 2006.  Family history unknown to patient. As far as she knows no heart disease, diabetes, cancers.  Lives in [place]. Works as an Engineer, manufacturing. Spends days exercising, running errands, working in the yard. No alcohol, no cigarettes or tobacco, no drug use.   Objective:   Vitals:   12/29/22 1040 12/29/22 1052  BP: (!) 143/66 130/84  Pulse: 71 63  Resp: 12   Temp: 99.1 F (37.3 C)   TempSrc: Oral   SpO2: 98%   Weight: 150 lb 3.2  oz (68.1 kg)   Height: 5\' 5"  (1.651 m)     Physical Exam Well-appearing, no distress Oropharynx is moist and pink No palpable thyromegaly Heart rate is normal, irregularly irregular rhythm, strong radial and DP pulses, no appreciable murmurs Breathing regular and unlabored, no wheezing or crackles Skin warm and dry Abdomen non-tender  Assessment & Plan:   Problem List Items Addressed This Visit     Benign hypertension    Recent ED visit for this problem. BP was elevated to 170s systolic. Seemed to be asymptomatic at that time. Was referred back to PCP at [facility] who had left the practice. On hydrochlorothiazide 12.5 daily. BP today is 130/84, at goal. She is concerned because her blood pressure will frequently spike to 170s, 180s systolic. She's asymptomatic. Recommend no updates to prescription medications and keep blood pressure log to review in 1 month. At that visit will compare home BP cuff results with clinic BP.      Relevant Orders   BMP8+Anion Gap   Mixed hyperlipidemia - Primary    On pravastatin 20 mg. Lipid panel today.       Relevant Orders   Lipid Profile   Urinary urgency    On oxybutynin. Symptoms well-controlled during the day but waking up at night with urgency. Recommend no fluids after dinnertime and taking oxybutynin before bedtime.      PAC (premature atrial contraction)    Incidentally discovered. Heart rate irregular on exam. EKG shows PACs. Asymptomatic. TSH today.  Other Visit Diagnoses     Irregular heartbeat       Relevant Orders   EKG 12-Lead (Completed)   TSH       Return in 1 month for follow-up of high blood pressure.  Patient discussed with Dr. Carmela Hurt MD 12/29/2022, 6:08 PM  [phone]

## 2022-12-29 NOTE — Assessment & Plan Note (Addendum)
Recent ED visit for this problem. BP was elevated to 170s systolic. Seemed to be asymptomatic at that time. Was referred back to PCP at [facility] who had left the practice. On hydrochlorothiazide 12.5 daily. BP today is 130/84, at goal. She is concerned because her blood pressure will frequently spike to 170s, 180s systolic. She's asymptomatic. Recommend no updates to prescription medications and keep blood pressure log to review in 1 month. At that visit will compare home BP cuff results with clinic BP.

## 2022-12-29 NOTE — Assessment & Plan Note (Signed)
On pravastatin 20 mg. Lipid panel today.

## 2022-12-31 LAB — BMP8+ANION GAP
Anion Gap: 14 mmol/L (ref 10.0–18.0)
BUN/Creatinine Ratio: 16 (ref 12–28)
BUN: 12 mg/dL (ref 8–27)
CO2: 26 mmol/L (ref 20–29)
Calcium: 9.7 mg/dL (ref 8.7–10.3)
Chloride: 101 mmol/L (ref 96–106)
Creatinine, Ser: 0.76 mg/dL (ref 0.57–1.00)
Glucose: 92 mg/dL (ref 70–99)
Potassium: 4 mmol/L (ref 3.5–5.2)
Sodium: 141 mmol/L (ref 134–144)
eGFR: 79 mL/min/{1.73_m2} (ref >59)

## 2022-12-31 LAB — LIPID PANEL
Chol/HDL Ratio: 3.1 ratio (ref 0.0–4.4)
Cholesterol, Total: 152 mg/dL (ref 100–199)
HDL: 49 mg/dL (ref >39)
LDL Chol Calc (NIH): 81 mg/dL (ref 0–99)
Triglycerides: 123 mg/dL (ref 0–149)
VLDL Cholesterol Cal: 22 mg/dL (ref 5–40)

## 2022-12-31 LAB — TSH: TSH: 1.35 u[IU]/mL (ref 0.450–4.500)

## 2023-01-05 ENCOUNTER — Encounter: Payer: Self-pay | Admitting: Internal Medicine

## 2023-01-05 DIAGNOSIS — Z853 Personal history of malignant neoplasm of breast: Secondary | ICD-10-CM | POA: Insufficient documentation

## 2023-01-05 NOTE — Progress Notes (Signed)
Internal Medicine Clinic Attending  Case discussed with the resident at the time of the visit.  We reviewed the resident's history and exam and pertinent patient test results.  I agree with the assessment, diagnosis, and plan of care documented in the resident's note.  

## 2023-01-28 ENCOUNTER — Encounter: Payer: Self-pay | Admitting: Student

## 2023-01-28 ENCOUNTER — Ambulatory Visit (INDEPENDENT_AMBULATORY_CARE_PROVIDER_SITE_OTHER): Payer: 59 | Admitting: Student

## 2023-01-28 ENCOUNTER — Other Ambulatory Visit: Payer: Self-pay

## 2023-01-28 VITALS — BP 154/80 | HR 70 | Temp 98.1°F | Ht 65.0 in | Wt 149.3 lb

## 2023-01-28 DIAGNOSIS — I10 Essential (primary) hypertension: Secondary | ICD-10-CM

## 2023-01-28 DIAGNOSIS — M1612 Unilateral primary osteoarthritis, left hip: Secondary | ICD-10-CM

## 2023-01-28 DIAGNOSIS — M1990 Unspecified osteoarthritis, unspecified site: Secondary | ICD-10-CM

## 2023-01-28 MED ORDER — HYDROCHLOROTHIAZIDE 25 MG PO TABS: 25.0000 mg | ORAL_TABLET | Freq: Every day | ORAL | 3 refills | Status: DC

## 2023-01-28 NOTE — Assessment & Plan Note (Addendum)
Patient presented to the office for a 1 month follow-up of uncontrolled hypertension.  She is on a current regimen of hydrochlorothiazide 12.5 mg.  She is currently asymptomatic.  Patient reports compliance with hydrochlorothiazide and keeps a blood pressure log at home with averages ranging from 130s to 140s over 70s- 80s.  Patient brought cuff to appointment, home cuff is accurate.  Blood pressure today is 137/78. Plan -Return in one month for nurse BP check -BMP check in one month -Increase hydrochlorothiazide to 25mg  -Patient educated on exercise and limiting salt in diet

## 2023-01-28 NOTE — Progress Notes (Signed)
Established Patient Office Visit  Subjective   Patient ID: Jasmine Vaughan, female    DOB: Jan 06, 1941  Age: 82 y.o. MRN: [id number]  Chief Complaint  Patient presents with   Follow-up    One month ...   Hypertension   Leg Pain    Pt stated that her ( left ) started hurting yesterday .Marland Kitchen She stated that it was after having her teeth pulled ....    Jasmine Vaughan is a 82 year old female with a past medical history significant for HTN, osteoarthritis, and PACs who is here for a one month follow up for HTN and Left groin pain. Please see problem based assessment and plan for additional details.     Past Medical History:  Diagnosis Date   Arthritis    Hypertension          Objective:     BP (!) 154/80 (BP Location: Right Arm, Patient Position: Sitting, Cuff Size: Normal)   Pulse 70   Temp 98.1 F (36.7 C) (Oral)   Ht 5\' 5"  (1.651 m)   Wt 149 lb 4.8 oz (67.7 kg)   SpO2 98%   BMI 24.84 kg/m  BP Readings from Last 3 Encounters:  01/28/23 (!) 154/80  12/29/22 130/84  12/10/22 (!) 168/98   Wt Readings from Last 3 Encounters:  01/28/23 149 lb 4.8 oz (67.7 kg)  12/29/22 150 lb 3.2 oz (68.1 kg)  12/10/22 155 lb (70.3 kg)      Physical Exam Constitutional:      Appearance: She is normal weight.  Cardiovascular:     Rate and Rhythm: Normal rate and regular rhythm.     Pulses: Normal pulses.     Heart sounds: Normal heart sounds, S1 normal and S2 normal.  Pulmonary:     Effort: Pulmonary effort is normal.     Breath sounds: Normal breath sounds.  Musculoskeletal:        General: Tenderness (Left hip tenderness during ROM) present. Normal range of motion.     Right lower leg: No edema.     Left lower leg: No edema.  Skin:    General: Skin is warm and dry.  Neurological:     Mental Status: She is alert and oriented to person, place, and time.  Psychiatric:        Mood and Affect: Mood normal.       No results found for any visits on 01/28/23.  Last  CBC Lab Results  Component Value Date   WBC 4.2 05/23/2018   HGB 14.3 05/23/2018   HCT 46.8 (H) 05/23/2018   MCV 86.8 05/23/2018   MCH 26.5 05/23/2018   RDW 13.3 05/23/2018   PLT 405 (H) 05/23/2018   Last metabolic panel Lab Results  Component Value Date   GLUCOSE 92 12/29/2022   NA 141 12/29/2022   K 4.0 12/29/2022   CL 101 12/29/2022   CO2 26 12/29/2022   BUN 12 12/29/2022   CREATININE 0.76 12/29/2022   EGFR 79 12/29/2022   CALCIUM 9.7 12/29/2022   PROT 7.8 05/23/2018   ALBUMIN 3.8 05/23/2018   BILITOT 0.6 05/23/2018   ALKPHOS 58 05/23/2018   AST 19 05/23/2018   ALT 16 05/23/2018   ANIONGAP 11 05/23/2018   Last lipids Lab Results  Component Value Date   CHOL 152 12/29/2022   HDL 49 12/29/2022   LDLCALC 81 12/29/2022   TRIG 123 12/29/2022   CHOLHDL 3.1 12/29/2022      The ASCVD Risk score (  Arnett DK, et al., 2019) failed to calculate for the following reasons:   The 2019 ASCVD risk score is only valid for ages 46 to 40    Assessment & Plan:   Problem List Items Addressed This Visit       Cardiovascular and Mediastinum   Uncontrolled hypertension    Patient presented to the office for a 1 month follow-up of uncontrolled hypertension.  She is on a current regimen of hydrochlorothiazide 12.5 mg.  She is currently asymptomatic.  Patient reports compliance with hydrochlorothiazide and keeps a blood pressure log at home with averages ranging from 130s to 140s over 70s- 80s.  Patient brought cuff to appointment, home cuff is accurate.  Blood pressure today is 137/78. Plan -Return in one month for nurse BP check -BMP check in one month -Increase hydrochlorothiazide to 25mg  -Patient educated on exercise and limiting salt in diet       Relevant Medications   hydrochlorothiazide (HYDRODIURIL) 25 MG tablet     Musculoskeletal and Integument   Osteoarthritis of left hip - Primary    Patient presents with a 1 month history of left-sided hip pain in the setting of  bilateral osteoarthritis.  She stated that the pain will wax and wane but is primarily located in the left groin with radiation to her left lower back and left knee.  Her pain is worse in the morning and is accompanied with stiffness.  She reports increased pain going up steps.  She is currently prescribed meloxicam and is taking Tylenol over-the-counter, neither help with the pain.  She denies physical therapy in the past or being evaluated by an orthopedic surgeon.  A bilateral hip and pelvis x-ray in 2019 showed mild bilateral hip osteoarthritis. Plan -Patient was provided education on the nature of osteoarthritis -Bilateral hip and pelvis x-ray ordered -Physical therapy referral placed -Will follow-up with patient in about 3 months, patient is aware that she will potentially need injections for surgery in the future         Return in about 3 months (around 04/30/2023) for BP, OA.    Faith Rogue, DO

## 2023-01-28 NOTE — Patient Instructions (Signed)
Please stop taking hydrochlorothiazide 12.5mg . Please start taking hydrochlorothiazide 25mg 

## 2023-01-28 NOTE — Assessment & Plan Note (Signed)
Patient presents with a 1 month history of left-sided hip pain in the setting of bilateral osteoarthritis.  She stated that the pain will wax and wane but is primarily located in the left groin with radiation to her left lower back and left knee.  Her pain is worse in the morning and is accompanied with stiffness.  She reports increased pain going up steps.  She is currently prescribed meloxicam and is taking Tylenol over-the-counter, neither help with the pain.  She denies physical therapy in the past or being evaluated by an orthopedic surgeon.  A bilateral hip and pelvis x-ray in 2019 showed mild bilateral hip osteoarthritis. Plan -Patient was provided education on the nature of osteoarthritis -Bilateral hip and pelvis x-ray ordered -Physical therapy referral placed -Will follow-up with patient in about 3 months, patient is aware that she will potentially need injections for surgery in the future

## 2023-02-09 NOTE — Progress Notes (Signed)
Internal Medicine Clinic Attending  I was physically present during the key portions of the resident provided service and participated in the medical decision making of patient's management care. I reviewed pertinent patient test results.  The assessment, diagnosis, and plan were formulated together and I agree with the documentation in the resident's note.  Gust Rung, DO

## 2023-02-09 NOTE — Addendum Note (Signed)
Addended by: Gust Rung on: 02/09/2023 01:56 PM   Modules accepted: Level of Service

## 2023-02-21 NOTE — Therapy (Signed)
OUTPATIENT PHYSICAL THERAPY LOWER EXTREMITY EVALUATION   Patient Name: Jasmine Vaughan MRN: [id number] DOB:[birth year], 82 y.o., female Today's Date: 02/21/2023  END OF SESSION:   Past Medical History:  Diagnosis Date   Arthritis    Hypertension    Past Surgical History:  Procedure Laterality Date   BREAST SURGERY     Patient Active Problem List   Diagnosis Date Noted   Personal history of breast cancer 01/05/2023   Uncontrolled hypertension 12/29/2022   Mixed hyperlipidemia 12/29/2022   Osteoarthritis of left hip 12/29/2022   Urinary urgency 12/29/2022   PAC (premature atrial contraction) 12/29/2022    PCP: Marrianne Mood, MD  REFERRING PROVIDER: Gust Rung, DO  REFERRING DIAG: M19.90 (ICD-10-CM) - Arthritis   THERAPY DIAG:  No diagnosis found.  Rationale for Evaluation and Treatment: Rehabilitation  ONSET DATE: ***  SUBJECTIVE:   SUBJECTIVE STATEMENT: ***  PERTINENT HISTORY: *** PAIN:  Are you having pain? Yes: {yespain:27235::"NPRS scale: ***/10","Pain location: ***","Pain description: ***","Aggravating factors: ***","Relieving factors: ***"}  PRECAUTIONS: {Therapy precautions:24002}  RED FLAGS: {PT Red Flags:29287}   WEIGHT BEARING RESTRICTIONS: {Yes ***/No:24003}  FALLS:  Has patient fallen in last 6 months? {fallsyesno:27318}  LIVING ENVIRONMENT: Lives with: {[facility] lives with:25569::"lives with their family"} Lives in: {Lives in:25570} Stairs: {opstairs:27293} Has following equipment at home: {Assistive devices:23999}  OCCUPATION: ***  PLOF: {PLOF:24004}  PATIENT GOALS: ***  NEXT MD VISIT: ***  OBJECTIVE:   DIAGNOSTIC FINDINGS: ***  PATIENT SURVEYS:  {rehab surveys:24030}  COGNITION: Overall cognitive status: {cognition:24006}     SENSATION: {sensation:27233}  EDEMA:  {edema:24020}  MUSCLE LENGTH: Hamstrings: Right *** deg; Left *** deg Thomas test: Right *** deg; Left *** deg  POSTURE:  {posture:25561}  PALPATION: ***  LOWER EXTREMITY ROM:  {AROM/PROM:27142} ROM Right eval Left eval  Hip flexion    Hip extension    Hip abduction    Hip adduction    Hip internal rotation    Hip external rotation    Knee flexion    Knee extension    Ankle dorsiflexion    Ankle plantarflexion    Ankle inversion    Ankle eversion     (Blank rows = not tested)  LOWER EXTREMITY MMT:  MMT Right eval Left eval  Hip flexion    Hip extension    Hip abduction    Hip adduction    Hip internal rotation    Hip external rotation    Knee flexion    Knee extension    Ankle dorsiflexion    Ankle plantarflexion    Ankle inversion    Ankle eversion     (Blank rows = not tested)  LOWER EXTREMITY SPECIAL TESTS:  {LEspecialtests:26242}  FUNCTIONAL TESTS:  {Functional tests:24029}  GAIT: Distance walked: *** Assistive device utilized: {Assistive devices:23999} Level of assistance: {Levels of assistance:24026} Comments: ***   TODAY'S TREATMENT:  DATE: ***    PATIENT EDUCATION:  Education details: *** Person educated: {Person educated:25204} Education method: {Education Method:25205} Education comprehension: {Education Comprehension:25206}  HOME EXERCISE PROGRAM: ***  ASSESSMENT:  CLINICAL IMPRESSION: Patient is a 82 y.o. female who was seen today for physical therapy evaluation and treatment for M19.90 (ICD-10-CM) - Arthritis .   OBJECTIVE IMPAIRMENTS: {opptimpairments:25111}.   ACTIVITY LIMITATIONS: {activitylimitations:27494}  PARTICIPATION LIMITATIONS: {participationrestrictions:25113}  PERSONAL FACTORS: {Personal factors:25162} are also affecting patient's functional outcome.   REHAB POTENTIAL: {rehabpotential:25112}  CLINICAL DECISION MAKING: {clinical decision making:25114}  EVALUATION COMPLEXITY: {Evaluation  complexity:25115}   GOALS:  SHORT TERM GOALS: Target date: *** *** Baseline: Goal status: INITIAL  2.  *** Baseline:  Goal status: INITIAL  3.  *** Baseline:  Goal status: INITIAL  4.  *** Baseline:  Goal status: INITIAL  5.  *** Baseline:  Goal status: INITIAL  6.  *** Baseline:  Goal status: INITIAL  LONG TERM GOALS: Target date: ***  *** Baseline:  Goal status: INITIAL  2.  *** Baseline:  Goal status: INITIAL  3.  *** Baseline:  Goal status: INITIAL  4.  *** Baseline:  Goal status: INITIAL  5.  *** Baseline:  Goal status: INITIAL  6.  *** Baseline:  Goal status: INITIAL   PLAN:  PT FREQUENCY: {rehab frequency:25116}  PT DURATION: {rehab duration:25117}  PLANNED INTERVENTIONS: {rehab planned interventions:25118::"Therapeutic exercises","Therapeutic activity","Neuromuscular re-education","Balance training","Gait training","Patient/Family education","Self Care","Joint mobilization"}  PLAN FOR NEXT SESSION: ***   Latoya Maulding, PT 02/21/2023, 10:10 PM

## 2023-02-22 ENCOUNTER — Ambulatory Visit: Payer: 59 | Attending: Internal Medicine

## 2023-02-22 ENCOUNTER — Other Ambulatory Visit: Payer: Self-pay

## 2023-02-22 DIAGNOSIS — M6281 Muscle weakness (generalized): Secondary | ICD-10-CM | POA: Diagnosis not present

## 2023-02-22 DIAGNOSIS — M79652 Pain in left thigh: Secondary | ICD-10-CM | POA: Insufficient documentation

## 2023-02-22 DIAGNOSIS — M1990 Unspecified osteoarthritis, unspecified site: Secondary | ICD-10-CM | POA: Diagnosis not present

## 2023-02-22 DIAGNOSIS — R262 Difficulty in walking, not elsewhere classified: Secondary | ICD-10-CM | POA: Diagnosis not present

## 2023-03-07 NOTE — Therapy (Signed)
OUTPATIENT PHYSICAL THERAPY LOWER TREATMENT NOTE   Patient Name: [place] Contreraz MRN: [id number] DOB:1941-02-12, 82 y.o., female Today's Date: 03/08/2023  END OF SESSION:  PT End of Session - 03/08/23 1113     Visit Number 2    Number of Visits 13    Date for PT Re-Evaluation 04/15/23    Authorization Type [employer] DUAL COMPLETE; MEDICAID OF [place]    Authorization - Visit Number 1    Authorization - Number of Visits 27    Progress Note Due on Visit 10    PT Start Time 1105    PT Stop Time 1145    PT Time Calculation (min) 40 min    Activity Tolerance Patient tolerated treatment well    Behavior During Therapy WFL for tasks assessed/performed              Past Medical History:  Diagnosis Date   Arthritis    Hypertension    Past Surgical History:  Procedure Laterality Date   BREAST SURGERY     Patient Active Problem List   Diagnosis Date Noted   Personal history of breast cancer 01/05/2023   Uncontrolled hypertension 12/29/2022   Mixed hyperlipidemia 12/29/2022   Osteoarthritis of left hip 12/29/2022   Urinary urgency 12/29/2022   PAC (premature atrial contraction) 12/29/2022    PCP: Marrianne Mood, MD  REFERRING PROVIDER: Gust Rung, DO  REFERRING DIAG: M19.90 (ICD-10-CM) - Arthritis   THERAPY DIAG:  Pain in left thigh  Muscle weakness (generalized)  Difficulty in walking, not elsewhere classified  Rationale for Evaluation and Treatment: Rehabilitation  ONSET DATE: 2-3 years  SUBJECTIVE:   SUBJECTIVE STATEMENT: Pt reports she has been completing er HEP. L thigh pain is about the same.  EVAL: Chronic L thigh pain with insidious onset.  Does chair exercises about 1-2x per week.  PERTINENT HISTORY: Arthritis, HTN  PAIN:  Are you having pain? Yes: NPRS scale: 5/10 usually with walking; low/10 with rest/sitting Pain location: L thigh Pain description: Ache, sharp Aggravating factors: Standing and walking: 20-30 mins Relieving  factors: Sitting down Pain range on eval: 1-10/10  PRECAUTIONS: None  RED FLAGS: None   WEIGHT BEARING RESTRICTIONS: No  FALLS:  Has patient fallen in last 6 months? 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, privately  PLOF: Independent  PATIENT GOALS: Pain relief  NEXT MD VISIT: Pt was not able to recall f/u appt  OBJECTIVE:   DIAGNOSTIC FINDINGS: Order for hip xray in Epic  PATIENT SURVEYS:  FOTO To be completed, wrong folder provided to pt  COGNITION: Overall cognitive status: Within functional limits for tasks assessed     SENSATION: WFL  EDEMA:  None palpated or observed  MUSCLE LENGTH: Hamstrings: Right WNLs ; Left WNLs  Thomas test: Right tight ; Left tight   POSTURE: rounded shoulders, forward head, and anterior pelvic tilt  PALPATION: TTP of the L vastus lateralis  LOWER EXTREMITY ROM:  WNLS bilat Active ROM Right eval Left eval  Hip flexion    Hip extension    Hip abduction    Hip adduction    Hip internal rotation   thigh pain at end range  Hip external rotation  thigh pain at end range  Knee flexion  thigh pain at end range  Knee extension    Ankle dorsiflexion  Ankle plantarflexion    Ankle inversion    Ankle eversion     (Blank rows = not tested)  LOWER EXTREMITY MMT:  MMT Right eval Left eval  Hip flexion 4 3+  Hip extension 3+ 3  Hip abduction 4 3+  Hip adduction    Hip internal rotation    Hip external rotation 4 3+  Knee flexion 4+ 4+  Knee extension 4+ 4 pain  Ankle dorsiflexion    Ankle plantarflexion    Ankle inversion    Ankle eversion     (Blank rows = not tested)  LOWER EXTREMITY SPECIAL TESTS:  Hip special tests: Luisa Hart (FABER) test: negative, SI compression test: negative, and Hip scouring test: negative  FUNCTIONAL TESTS:   SLS  L = 6", R = 13"  GAIT: Distance walked: 200' Assistive device utilized: None Level of assistance: Complete Independence Comments: gait pattern WNLs   TODAY'S TREATMENT:    [facility] Adult PT Treatment:                                                DATE: 03/08/23 Therapeutic Exercise: Quad set x10 5" Straight Leg Raise with Quad Set  x10 3" Supine Bridge c GTB abd set 2x10 reps 3" Hook lying Clamshell GTB 2x10 reps  Updated HEP Manual Therapy: STM to the L quad with emphasis on the vastus lateralis. TrPs and taut muscle bands were palpated and  MTPR was completed   [facility] Adult PT Treatment:                                                DATE: 02/22/23 Therapeutic Exercise: Developed, instructed in, and pt completed therex as noted in HEP                                                                                                                     PATIENT EDUCATION:  Education details: Eval findings, POC, HEP  Person educated: Patient Education method: Explanation, Demonstration, Tactile cues, Verbal cues, and Handouts Education comprehension: verbalized understanding, returned demonstration, verbal cues required, tactile cues required, and needs further education  HOME EXERCISE PROGRAM: Access Code: [email or web address] URL: [email or web address] Date: 03/08/2023 Prepared by: Joellyn Rued  Exercises - Supine Quad Set  - 1 x daily - 7 x weekly - 2 sets - 10 reps - 5 hold - Active Straight Leg Raise with Quad Set  - 1 x daily - 7 x weekly - 2 sets - 10 reps - 3 hold - Supine Bridge with Resistance Band  - 1 x daily - 7 x weekly - 2 sets - 10 reps - 3 hold - Hooklying Clamshell with Resistance  - 1 x daily - 7 x weekly - 2  sets - 10 reps - 3 hold  ASSESSMENT:  CLINICAL IMPRESSION: PT was completed for L quad STM with emphasis on the vastus lateralis where TrPs and taut muscle bands were palpated. MPTR was completed to these areas. Therex was then completed for quad/LE  strengthening and muscle activation. Pt tolerated PT today without adverse effects. Pt will continue to benefit from skilled PT to address impairments for improved L LE function with minimized pain.   EVAL: Patient is a 82 y.o. female who was seen today for physical therapy evaluation and treatment for M19.90 (ICD-10-CM) - Arthritis. Pt presents to PT with complaint of chronic L thigh pain. Pain is provoked by prolonged standing and walking, end range motions for the L hip and knee, resisted L knee ext, and palpation of the L vastus lateralis. Pain appears to be muscular in nature. Pt demonstrates decreased strength of the L hip and knee and has a decreased single leg stance time L vs R. Pt was initiated on a HEP. Pt will benefit from skilled PT 2w6 to address impairments to optimize function with less pain.   OBJECTIVE IMPAIRMENTS: decreased activity tolerance, difficulty walking, decreased strength, and pain.   ACTIVITY LIMITATIONS: carrying, lifting, bending, standing, squatting, stairs, locomotion level, and caring for others  PARTICIPATION LIMITATIONS: meal prep, cleaning, laundry, and occupation  PERSONAL FACTORS: Age, Fitness, Past/current experiences, and Time since onset of injury/illness/exacerbation are also affecting patient's functional outcome.   REHAB POTENTIAL: Good  CLINICAL DECISION MAKING: Stable/uncomplicated  EVALUATION COMPLEXITY: Low   GOALS:  SHORT TERM GOALS: Target date: 03/18/23 Pt will be Ind in an initial HEP  Baseline: started Goal status: INITIAL  2.  Pt will voice understanding of measures to assist in pain reduction  Baseline:  Goal status: INITIAL  LONG TERM GOALS: Target date: 04/15/23  Pt will be Ind in a final HEP to maintain achieved LOF  Baseline: started Goal status: INITIAL  2.  Increase L hips and knee strength by  MMT grade for improved function  Baseline: see flow sheets Goal status: INITIAL  3.  Increase L single leg stance to 12" or  greater as indication of improved strength and balance Baseline: L 2", R 13" Goal status: INITIAL  4.  Pt will report a 50% or greater improvement with L thigh pain with daily activities for improved function and QOL Baseline: 0-10/10 Goal status: INITIAL  5.  Pt's FOTO score will improved to the predicted value as indication of improved function  Baseline: TBA Goal status: INITIAL  6.  Assess balance per Sharlene Motts and set goal as indicated (MCID 5 pts) Baseline: TBA Goal status: INITIAL   PLAN:  PT FREQUENCY: 2x/week  PT DURATION: 6 weeks  PLANNED INTERVENTIONS: Therapeutic exercises, Therapeutic activity, Neuromuscular re-education, Balance training, Gait training, Patient/Family education, Self Care, Joint mobilization, Stair training, Aquatic Therapy, Dry Needling, Cryotherapy, Moist heat, Taping, Ultrasound, Ionotophoresis 4mg /ml Dexamethasone, Manual therapy, and Re-evaluation  PLAN FOR NEXT SESSION: Assess FOTO and Berg; assess response to HEP; progress therex as indicated; use of modalities, manual therapy; and TPDN as indicated.  Zola Runion MS, PT 03/08/23 12:44 PM

## 2023-03-08 ENCOUNTER — Ambulatory Visit: Payer: 59

## 2023-03-08 DIAGNOSIS — M6281 Muscle weakness (generalized): Secondary | ICD-10-CM | POA: Diagnosis not present

## 2023-03-08 DIAGNOSIS — R262 Difficulty in walking, not elsewhere classified: Secondary | ICD-10-CM

## 2023-03-08 DIAGNOSIS — M1990 Unspecified osteoarthritis, unspecified site: Secondary | ICD-10-CM | POA: Diagnosis not present

## 2023-03-08 DIAGNOSIS — M79652 Pain in left thigh: Secondary | ICD-10-CM | POA: Diagnosis not present

## 2023-03-09 ENCOUNTER — Ambulatory Visit: Payer: 59 | Admitting: Physical Therapy

## 2023-03-09 ENCOUNTER — Encounter: Payer: Self-pay | Admitting: Physical Therapy

## 2023-03-09 DIAGNOSIS — M79652 Pain in left thigh: Secondary | ICD-10-CM

## 2023-03-09 DIAGNOSIS — R262 Difficulty in walking, not elsewhere classified: Secondary | ICD-10-CM

## 2023-03-09 DIAGNOSIS — M6281 Muscle weakness (generalized): Secondary | ICD-10-CM

## 2023-03-09 DIAGNOSIS — M1990 Unspecified osteoarthritis, unspecified site: Secondary | ICD-10-CM | POA: Diagnosis not present

## 2023-03-09 NOTE — Therapy (Signed)
OUTPATIENT PHYSICAL THERAPY LOWER TREATMENT NOTE   Patient Name: Jasmine Vaughan MRN: [id number] DOB:May 31, 1941, 82 y.o., female Today's Date: 03/09/2023  END OF SESSION:  PT End of Session - 03/09/23 0928     Visit Number 3    Number of Visits 13    Date for PT Re-Evaluation 04/15/23    Authorization Type [employer] DUAL COMPLETE; MEDICAID OF [place]    Authorization - Visit Number 2    Authorization - Number of Visits 27    Progress Note Due on Visit 10    PT Start Time 0930    PT Stop Time 1020    PT Time Calculation (min) 50 min              Past Medical History:  Diagnosis Date   Arthritis    Hypertension    Past Surgical History:  Procedure Laterality Date   BREAST SURGERY     Patient Active Problem List   Diagnosis Date Noted   Personal history of breast cancer 01/05/2023   Uncontrolled hypertension 12/29/2022   Mixed hyperlipidemia 12/29/2022   Osteoarthritis of left hip 12/29/2022   Urinary urgency 12/29/2022   PAC (premature atrial contraction) 12/29/2022    PCP: Marrianne Mood, MD  REFERRING PROVIDER: Gust Rung, DO  REFERRING DIAG: M19.90 (ICD-10-CM) - Arthritis   THERAPY DIAG:  Pain in left thigh  Muscle weakness (generalized)  Difficulty in walking, not elsewhere classified  Rationale for Evaluation and Treatment: Rehabilitation  ONSET DATE: 2-3 years  SUBJECTIVE:   SUBJECTIVE STATEMENT: Pt reports she let the dogs out at 3 am and did not have pain. But when I got up it was a 7/10.   EVAL: Chronic L thigh pain with insidious onset.  Does chair exercises about 1-2x per week.  PERTINENT HISTORY: Arthritis, HTN  PAIN:  Are you having pain? Yes: NPRS scale: 5/10 usually with walking; low/10 with rest/sitting Pain location: L thigh Pain description: Ache, sharp Aggravating factors: Standing and walking: 20-30 mins Relieving factors: Sitting down Pain range on eval: 1-10/10  PRECAUTIONS: None  RED  FLAGS: None   WEIGHT BEARING RESTRICTIONS: No  FALLS:  Has patient fallen in last 6 months? 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, privately  PLOF: Independent  PATIENT GOALS: Pain relief  NEXT MD VISIT: Pt was not able to recall f/u appt  OBJECTIVE:   DIAGNOSTIC FINDINGS: Order for hip xray in Epic  PATIENT SURVEYS:  FOTO To be completed, wrong folder provided to pt 03/08/23: 51% intake to  64% prediction  COGNITION: Overall cognitive status: Within functional limits for tasks assessed     SENSATION: WFL  EDEMA:  None palpated or observed  MUSCLE LENGTH: Hamstrings: Right WNLs ; Left WNLs  Thomas test: Right tight ; Left tight   POSTURE: rounded shoulders, forward head, and anterior pelvic tilt  PALPATION: TTP of the L vastus lateralis  LOWER EXTREMITY ROM:  WNLS bilat Active ROM Right eval Left eval  Hip flexion    Hip extension    Hip abduction    Hip adduction    Hip internal rotation   thigh pain at end range  Hip external rotation  thigh pain at end range  Knee flexion  thigh pain at end range  Knee extension    Ankle dorsiflexion  Ankle plantarflexion    Ankle inversion    Ankle eversion     (Blank rows = not tested)  LOWER EXTREMITY MMT:  MMT Right eval Left eval  Hip flexion 4 3+  Hip extension 3+ 3  Hip abduction 4 3+  Hip adduction    Hip internal rotation    Hip external rotation 4 3+  Knee flexion 4+ 4+  Knee extension 4+ 4 pain  Ankle dorsiflexion    Ankle plantarflexion    Ankle inversion    Ankle eversion     (Blank rows = not tested)  LOWER EXTREMITY SPECIAL TESTS:  Hip special tests: Luisa Hart (FABER) test: negative, SI compression test: negative, and Hip scouring test: negative  FUNCTIONAL TESTS:  SLS  L = 6", R = 13" 03/09/23:FOTO:  taken on 3rd visit: 51% predicted to 64%  GAIT: Distance walked: 200' Assistive device utilized: None Level of assistance: Complete Independence Comments: gait pattern WNLs   TODAY'S TREATMENT:    [facility] Adult PT Treatment:                                                DATE: 03/09/23 Therapeutic Exercise: Qs 5 sec x 10 QS with SLR Heel slides left GTB clam supine with ab draw in Banded Bridge   Self Care: Self massage roller to thigh Therapeutic Activity: BERG  56/56   Modalities:  HMP left thigh x 10 minutes at end of session         [facility] Adult PT Treatment:                                                DATE: 03/08/23 Therapeutic Exercise: Quad set x10 5" Straight Leg Raise with Quad Set  x10 3" Supine Bridge c GTB abd set 2x10 reps 3" Hook lying Clamshell GTB 2x10 reps  Updated HEP Manual Therapy: STM to the L quad with emphasis on the vastus lateralis. TrPs and taut muscle bands were palpated and  MTPR was completed   [facility] Adult PT Treatment:                                                DATE: 02/22/23 Therapeutic Exercise: Developed, instructed in, and pt completed therex as noted in HEP                                                                                                                     PATIENT EDUCATION:  Education details: Eval findings, POC, HEP  Person educated: Patient Education method: Explanation, Demonstration, Tactile cues, Verbal cues, and Handouts Education  comprehension: verbalized understanding, returned demonstration, verbal cues required, tactile cues required, and needs further education  HOME EXERCISE PROGRAM: Access Code: [email or web address] URL: [email or web address] Date: 03/08/2023 Prepared by: Joellyn Rued  Exercises - Supine Quad Set  - 1 x daily - 7 x weekly - 2 sets - 10 reps - 5 hold - Active Straight Leg Raise with Quad Set  - 1 x daily - 7 x weekly - 2 sets - 10 reps - 3 hold - Supine Bridge with Resistance  Band  - 1 x daily - 7 x weekly - 2 sets - 10 reps - 3 hold - Hooklying Clamshell with Resistance  - 1 x daily - 7 x weekly - 2 sets - 10 reps - 3 hold  ASSESSMENT:  CLINICAL IMPRESSION: PT was completed for L quad self massage and review of HEP. Captured BERG with patient scoring 56/56. A moist heat pack was applied at end of session to decrease thigh pain and tenderness.  Pt tolerated PT today without adverse effects. Pt will continue to benefit from skilled PT to address impairments for improved L LE function with minimized pain.   EVAL: Patient is a 82 y.o. female who was seen today for physical therapy evaluation and treatment for M19.90 (ICD-10-CM) - Arthritis. Pt presents to PT with complaint of chronic L thigh pain. Pain is provoked by prolonged standing and walking, end range motions for the L hip and knee, resisted L knee ext, and palpation of the L vastus lateralis. Pain appears to be muscular in nature. Pt demonstrates decreased strength of the L hip and knee and has a decreased single leg stance time L vs R. Pt was initiated on a HEP. Pt will benefit from skilled PT 2w6 to address impairments to optimize function with less pain.   OBJECTIVE IMPAIRMENTS: decreased activity tolerance, difficulty walking, decreased strength, and pain.   ACTIVITY LIMITATIONS: carrying, lifting, bending, standing, squatting, stairs, locomotion level, and caring for others  PARTICIPATION LIMITATIONS: meal prep, cleaning, laundry, and occupation  PERSONAL FACTORS: Age, Fitness, Past/current experiences, and Time since onset of injury/illness/exacerbation are also affecting patient's functional outcome.   REHAB POTENTIAL: Good  CLINICAL DECISION MAKING: Stable/uncomplicated  EVALUATION COMPLEXITY: Low   GOALS:  SHORT TERM GOALS: Target date: 03/18/23 Pt will be Ind in an initial HEP  Baseline: started Goal status: INITIAL  2.  Pt will voice understanding of measures to assist in pain reduction   Baseline:  Goal status: INITIAL  LONG TERM GOALS: Target date: 04/15/23  Pt will be Ind in a final HEP to maintain achieved LOF  Baseline: started Goal status: INITIAL  2.  Increase L hips and knee strength by  MMT grade for improved function  Baseline: see flow sheets Goal status: INITIAL  3.  Increase L single leg stance to 12" or greater as indication of improved strength and balance Baseline: L 2", R 13" Goal status: INITIAL  4.  Pt will report a 50% or greater improvement with L thigh pain with daily activities for improved function and QOL Baseline: 0-10/10 Goal status: INITIAL  5.  Pt's FOTO score will improved to the predicted value as indication of improved function  Baseline: TBA 03/09/23: 51%  predicted to 64% Goal status: INITIAL  6.  Assess balance per Sharlene Motts and set goal as indicated (MCID 5 pts) Baseline: TBA 03/09/23:  56/56 Goal status: DEFERRED    PLAN:  PT FREQUENCY: 2x/week  PT DURATION: 6 weeks  PLANNED INTERVENTIONS: Therapeutic exercises, Therapeutic activity,  Neuromuscular re-education, Balance training, Gait training, Patient/Family education, Self Care, Joint mobilization, Stair training, Aquatic Therapy, Dry Needling, Cryotherapy, Moist heat, Taping, Ultrasound, Ionotophoresis 4mg /ml Dexamethasone, Manual therapy, and Re-evaluation  PLAN FOR NEXT SESSION: ; assess response to HEP; progress therex as indicated; use of modalities, manual therapy; and TPDN as indicated.  Jannette Spanner, PTA 03/09/23 10:59 AM Phone: [phone] Fax: [phone]

## 2023-03-15 ENCOUNTER — Encounter: Payer: Self-pay | Admitting: Physical Therapy

## 2023-03-15 ENCOUNTER — Ambulatory Visit: Payer: 59 | Attending: Internal Medicine | Admitting: Physical Therapy

## 2023-03-15 DIAGNOSIS — M6281 Muscle weakness (generalized): Secondary | ICD-10-CM | POA: Diagnosis not present

## 2023-03-15 DIAGNOSIS — R262 Difficulty in walking, not elsewhere classified: Secondary | ICD-10-CM | POA: Diagnosis not present

## 2023-03-15 DIAGNOSIS — M79652 Pain in left thigh: Secondary | ICD-10-CM | POA: Diagnosis not present

## 2023-03-15 NOTE — Therapy (Signed)
OUTPATIENT PHYSICAL THERAPY LOWER TREATMENT NOTE   Patient Name: Jasmine Vaughan MRN: [id number] DOB:12-07-1940, 82 y.o., female Today's Date: 03/15/2023  END OF SESSION:  PT End of Session - 03/15/23 1023     Visit Number 4    Number of Visits 13    Date for PT Re-Evaluation 04/15/23    Authorization Type [employer] DUAL COMPLETE; MEDICAID OF [place]    Authorization - Visit Number 3    Authorization - Number of Visits 27    Progress Note Due on Visit 10    PT Start Time 1020    PT Stop Time 1100    PT Time Calculation (min) 40 min              Past Medical History:  Diagnosis Date   Arthritis    Hypertension    Past Surgical History:  Procedure Laterality Date   BREAST SURGERY     Patient Active Problem List   Diagnosis Date Noted   Personal history of breast cancer 01/05/2023   Uncontrolled hypertension 12/29/2022   Mixed hyperlipidemia 12/29/2022   Osteoarthritis of left hip 12/29/2022   Urinary urgency 12/29/2022   PAC (premature atrial contraction) 12/29/2022    PCP: Marrianne Mood, MD  REFERRING PROVIDER: Gust Rung, DO  REFERRING DIAG: M19.90 (ICD-10-CM) - Arthritis   THERAPY DIAG:  Pain in left thigh  Muscle weakness (generalized)  Difficulty in walking, not elsewhere classified  Rationale for Evaluation and Treatment: Rehabilitation  ONSET DATE: 2-3 years  SUBJECTIVE:   SUBJECTIVE STATEMENT: I am limping a little today   EVAL: Chronic L thigh pain with insidious onset.  Does chair exercises about 1-2x per week.  PERTINENT HISTORY: Arthritis, HTN  PAIN:  Are you having pain? Yes: NPRS scale: 4/10 usually with walking; low/10 with rest/sitting Pain location: L thigh Pain description: Ache, sharp Aggravating factors: Standing and walking: 20-30 mins Relieving factors: Sitting down Pain range on eval: 1-10/10  PRECAUTIONS: None  RED FLAGS: None   WEIGHT BEARING RESTRICTIONS: No  FALLS:  Has patient fallen in  last 6 months? 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, privately  PLOF: Independent  PATIENT GOALS: Pain relief  NEXT MD VISIT: Pt was not able to recall f/u appt  OBJECTIVE:   DIAGNOSTIC FINDINGS: Order for hip xray in Epic  PATIENT SURVEYS:  FOTO To be completed, wrong folder provided to pt 03/08/23: 51% intake to  64% prediction  COGNITION: Overall cognitive status: Within functional limits for tasks assessed     SENSATION: WFL  EDEMA:  None palpated or observed  MUSCLE LENGTH: Hamstrings: Right WNLs ; Left WNLs  Thomas test: Right tight ; Left tight   POSTURE: rounded shoulders, forward head, and anterior pelvic tilt  PALPATION: TTP of the L vastus lateralis  LOWER EXTREMITY ROM:  WNLS bilat Active ROM Right eval Left eval Right 03/15/23   Hip flexion     Hip extension     Hip abduction     Hip adduction     Hip internal rotation   thigh pain at end range   Hip external rotation  thigh pain at end range   Knee flexion  thigh pain at end range 140  Knee extension     Ankle dorsiflexion     Ankle plantarflexion  Ankle inversion     Ankle eversion      (Blank rows = not tested)  LOWER EXTREMITY MMT:  MMT Right eval Left eval  Hip flexion 4 3+  Hip extension 3+ 3  Hip abduction 4 3+  Hip adduction    Hip internal rotation    Hip external rotation 4 3+  Knee flexion 4+ 4+  Knee extension 4+ 4 pain  Ankle dorsiflexion    Ankle plantarflexion    Ankle inversion    Ankle eversion     (Blank rows = not tested)  LOWER EXTREMITY SPECIAL TESTS:  Hip special tests: Luisa Hart (FABER) test: negative, SI compression test: negative, and Hip scouring test: negative  FUNCTIONAL TESTS:  SLS  L = 6", R = 13" 03/09/23:FOTO: taken on 3rd visit: 51% predicted to  64%  GAIT: Distance walked: 200' Assistive device utilized: None Level of assistance: Complete Independence Comments: gait pattern WNLs   TODAY'S TREATMENT:    [facility] Adult PT Treatment:                                                DATE: 03/15/23 Therapeutic Exercise: Nustep  LAQ QS SLR with ER x 10 Ball squeeze with SAQ Left x8  Bridge with ball squeeze  Hip flexor stretch EOM Supine clam GTB  Self Care: Tennis ball vs massage roller to left vastus lateralis    [facility] Adult PT Treatment:                                                DATE: 03/09/23 Therapeutic Exercise: Qs 5 sec x 10 QS with SLR Heel slides left GTB clam supine with ab draw in Banded Bridge   Self Care: Self massage roller to thigh Therapeutic Activity: BERG  56/56   Modalities:  HMP left thigh x 10 minutes at end of session     [facility] Adult PT Treatment:                                                DATE: 03/08/23 Therapeutic Exercise: Quad set x10 5" Straight Leg Raise with Quad Set  x10 3" Supine Bridge c GTB abd set 2x10 reps 3" Hook lying Clamshell GTB 2x10 reps  Updated HEP Manual Therapy: STM to the L quad with emphasis on the vastus lateralis. TrPs and taut muscle bands were palpated and  MTPR was completed   [facility] Adult PT Treatment:                                                DATE: 02/22/23 Therapeutic Exercise: Developed, instructed in, and pt completed therex as noted in HEP  PATIENT EDUCATION:  Education details: Eval findings, POC, HEP  Person educated: Patient Education method: Explanation, Demonstration, Tactile cues, Verbal cues, and Handouts Education comprehension: verbalized understanding, returned demonstration, verbal cues required, tactile cues required, and needs further education  HOME EXERCISE PROGRAM: Access Code: [email or web address] URL:  [email or web address] Date: 03/08/2023 Prepared by: Joellyn Rued  Exercises - Supine Quad Set  - 1 x daily - 7 x weekly - 2 sets - 10 reps - 5 hold - Active Straight Leg Raise with Quad Set  - 1 x daily - 7 x weekly - 2 sets - 10 reps - 3 hold - Supine Bridge with Resistance Band  - 1 x daily - 7 x weekly - 2 sets - 10 reps - 3 hold - Hooklying Clamshell with Resistance  - 1 x daily - 7 x weekly - 2 sets - 10 reps - 3 hold  - Modified Thomas Stretch  - 1 x daily - 7 x weekly - 1 sets - 30 reps - 30 hold - Straight Leg Raise with External Rotation  - 1 x daily - 7 x weekly - 1-2 sets - 10 reps  ASSESSMENT:  CLINICAL IMPRESSION: Pt reports 4/10 pain on arrival. Began Nustep and pt reported no pain afterward. She has been working on self massage with rolling pin to her thigh, which provides short term relief. LTG# 2 met.  Again instructed is self massage and trigger point release using roller vs tennis ball. Continued with quad activation. Noted decrease strength for SLR when in ER. These were added to HEP. Continued with VMO activation and quad flexibility. Updated HEP. Pt will continue to benefit from skilled PT to address impairments for improved L LE function with minimized pain.   EVAL: Patient is a 82 y.o. female who was seen today for physical therapy evaluation and treatment for M19.90 (ICD-10-CM) - Arthritis. Pt presents to PT with complaint of chronic L thigh pain. Pain is provoked by prolonged standing and walking, end range motions for the L hip and knee, resisted L knee ext, and palpation of the L vastus lateralis. Pain appears to be muscular in nature. Pt demonstrates decreased strength of the L hip and knee and has a decreased single leg stance time L vs R. Pt was initiated on a HEP. Pt will benefit from skilled PT 2w6 to address impairments to optimize function with less pain.   OBJECTIVE IMPAIRMENTS: decreased activity tolerance, difficulty walking, decreased strength,  and pain.   ACTIVITY LIMITATIONS: carrying, lifting, bending, standing, squatting, stairs, locomotion level, and caring for others  PARTICIPATION LIMITATIONS: meal prep, cleaning, laundry, and occupation  PERSONAL FACTORS: Age, Fitness, Past/current experiences, and Time since onset of injury/illness/exacerbation are also affecting patient's functional outcome.   REHAB POTENTIAL: Good  CLINICAL DECISION MAKING: Stable/uncomplicated  EVALUATION COMPLEXITY: Low   GOALS:  SHORT TERM GOALS: Target date: 03/18/23 Pt will be Ind in an initial HEP  Baseline: started 03/15/23: Pt reports intermittent compliance  Goal status: ONGOING  2.  Pt will voice understanding of measures to assist in pain reduction  Baseline:  03/15/23: has floor pedals, heat, massager  Goal status: MET  LONG TERM GOALS: Target date: 04/15/23  Pt will be Ind in a final HEP to maintain achieved LOF  Baseline: started Goal status: INITIAL  2.  Increase L hips and knee strength by  MMT grade for improved function  Baseline: see flow sheets Goal status: INITIAL  3.  Increase L single leg stance to 12" or  greater as indication of improved strength and balance Baseline: L 2", R 13" Goal status: INITIAL  4.  Pt will report a 50% or greater improvement with L thigh pain with daily activities for improved function and QOL Baseline: 0-10/10 Goal status: INITIAL  5.  Pt's FOTO score will improved to the predicted value as indication of improved function  Baseline: TBA 03/09/23: 51%  predicted to 64% Goal status: INITIAL  6.  Assess balance per Sharlene Motts and set goal as indicated (MCID 5 pts) Baseline: TBA 03/09/23:  56/56 Goal status: DEFERRED    PLAN:  PT FREQUENCY: 2x/week  PT DURATION: 6 weeks  PLANNED INTERVENTIONS: Therapeutic exercises, Therapeutic activity, Neuromuscular re-education, Balance training, Gait training, Patient/Family education, Self Care, Joint mobilization, Stair training, Aquatic  Therapy, Dry Needling, Cryotherapy, Moist heat, Taping, Ultrasound, Ionotophoresis 4mg /ml Dexamethasone, Manual therapy, and Re-evaluation  PLAN FOR NEXT SESSION: ; assess response to HEP; progress therex as indicated; use of modalities, manual therapy; and TPDN as indicated.  Jannette Spanner, PTA 03/15/23 12:59 PM Phone: [phone] Fax: [phone]

## 2023-03-16 NOTE — Therapy (Signed)
OUTPATIENT PHYSICAL THERAPY LOWER TREATMENT NOTE   Patient Name: Jasmine Vaughan MRN: [id number] DOB:[birth year], 82 y.o., female Today's Date: 03/17/2023  END OF SESSION:  PT End of Session - 03/17/23 1101     Visit Number 5    Number of Visits 13    Date for PT Re-Evaluation 04/15/23    Authorization Type [employer] DUAL COMPLETE; MEDICAID OF [place]    Authorization - Visit Number 4    Authorization - Number of Visits 27    Progress Note Due on Visit 10    PT Start Time 1100    PT Stop Time 1145    PT Time Calculation (min) 45 min    Activity Tolerance Patient tolerated treatment well    Behavior During Therapy WFL for tasks assessed/performed               Past Medical History:  Diagnosis Date   Arthritis    Hypertension    Past Surgical History:  Procedure Laterality Date   BREAST SURGERY     Patient Active Problem List   Diagnosis Date Noted   Personal history of breast cancer 01/05/2023   Uncontrolled hypertension 12/29/2022   Mixed hyperlipidemia 12/29/2022   Osteoarthritis of left hip 12/29/2022   Urinary urgency 12/29/2022   PAC (premature atrial contraction) 12/29/2022    PCP: Marrianne Mood, MD  REFERRING PROVIDER: Gust Rung, DO  REFERRING DIAG: M19.90 (ICD-10-CM) - Arthritis   THERAPY DIAG:  Pain in left thigh  Muscle weakness (generalized)  Difficulty in walking, not elsewhere classified  Rationale for Evaluation and Treatment: Rehabilitation  ONSET DATE: 2-3 years  SUBJECTIVE:   SUBJECTIVE STATEMENT: Pt reports waking up with more pain today. Pt is not sure why her L thigh is bothering her more today. Pt reports she is using the rolling pin, tennis ball and moist heat  EVAL: Chronic L thigh pain with insidious onset.  Does chair exercises about 1-2x per week.  PERTINENT HISTORY: Arthritis, HTN  PAIN:  Are you having pain? Yes: NPRS scale: 8/10 03/17/23; low/10 with rest/sitting Pain location: L thigh Pain  description: Ache, sharp Aggravating factors: Standing and walking: 20-30 mins Relieving factors: Sitting down Pain range on eval: 1-10/10  PRECAUTIONS: None  RED FLAGS: None   WEIGHT BEARING RESTRICTIONS: No  FALLS:  Has patient fallen in last 6 months? 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, privately  PLOF: Independent  PATIENT GOALS: Pain relief  NEXT MD VISIT: Pt was not able to recall f/u appt  OBJECTIVE:   DIAGNOSTIC FINDINGS: Order for hip xray in Epic  PATIENT SURVEYS:  FOTO To be completed, wrong folder provided to pt 03/08/23: 51% intake to  64% prediction  COGNITION: Overall cognitive status: Within functional limits for tasks assessed     SENSATION: WFL  EDEMA:  None palpated or observed  MUSCLE LENGTH: Hamstrings: Right WNLs ; Left WNLs  Thomas test: Right tight ; Left tight   POSTURE: rounded shoulders, forward head, and anterior pelvic tilt  PALPATION: TTP of the L vastus lateralis  LOWER EXTREMITY ROM:  WNLS bilat Active ROM Right eval Left eval Right 03/15/23   Hip flexion     Hip extension     Hip abduction     Hip adduction     Hip internal rotation  thigh pain at end range   Hip external rotation  thigh pain at end range   Knee flexion  thigh pain at end range 140  Knee extension     Ankle dorsiflexion     Ankle plantarflexion     Ankle inversion     Ankle eversion      (Blank rows = not tested)  LOWER EXTREMITY MMT:  MMT Right eval Left eval  Hip flexion 4 3+  Hip extension 3+ 3  Hip abduction 4 3+  Hip adduction    Hip internal rotation    Hip external rotation 4 3+  Knee flexion 4+ 4+  Knee extension 4+ 4 pain  Ankle dorsiflexion    Ankle plantarflexion    Ankle inversion    Ankle eversion     (Blank rows = not tested)  LOWER  EXTREMITY SPECIAL TESTS:  Hip special tests: Luisa Hart (FABER) test: negative, SI compression test: negative, and Hip scouring test: negative  FUNCTIONAL TESTS:  SLS  L = 6", R = 13" 03/09/23:FOTO: taken on 3rd visit: 51% predicted to 64%  GAIT: Distance walked: 200' Assistive device utilized: None Level of assistance: Complete Independence Comments: gait pattern WNLs   TODAY'S TREATMENT:    [facility] Adult PT Treatment:                                                DATE: 03/17/23 Therapeutic Exercise: Nustep 5 mins L4 UE/LE LAQ x10 5" c ball squeeze QS x10 5" SLR with ER 2x10 Ball squeeze with SAQ x10  Bridge with ball squeeze  Supine clam x10 GTB Hip flexor stretch EOM Manual Therapy: STM and MTPR to the L quad- rectus femoris and vastus lateralis  [facility] Adult PT Treatment:                                                DATE: 03/15/23 Therapeutic Exercise: Nustep  LAQ QS SLR with ER x 10 Ball squeeze with SAQ Left x8  Bridge with ball squeeze  Hip flexor stretch EOM Supine clam GTB  Self Care: Tennis ball vs massage roller to left vastus lateralis    [facility] Adult PT Treatment:                                                DATE: 03/09/23 Therapeutic Exercise: Qs 5 sec x 10 QS with SLR Heel slides left GTB clam supine with ab draw in Banded Bridge   Self Care: Self massage roller to thigh Therapeutic Activity: BERG  56/56   Modalities:  HMP left thigh x 10 minutes at end of session  PATIENT EDUCATION:  Education details: Eval findings, POC, HEP  Person educated: Patient Education method: Explanation, Demonstration, Tactile cues, Verbal cues, and Handouts Education comprehension: verbalized understanding, returned demonstration, verbal cues required, tactile cues required, and needs further education  HOME EXERCISE PROGRAM: Access Code: [email or web address] URL:  [email or web address] Date: 03/08/2023 Prepared by: Joellyn Rued  Exercises - Supine Quad Set  - 1 x daily - 7 x weekly - 2 sets - 10 reps - 5 hold - Active Straight Leg Raise with Quad Set  - 1 x daily - 7 x weekly - 2 sets - 10 reps - 3 hold - Supine Bridge with Resistance Band  - 1 x daily - 7 x weekly - 2 sets - 10 reps - 3 hold - Hooklying Clamshell with Resistance  - 1 x daily - 7 x weekly - 2 sets - 10 reps - 3 hold  - Modified Thomas Stretch  - 1 x daily - 7 x weekly - 1 sets - 30 reps - 30 hold - Straight Leg Raise with External Rotation  - 1 x daily - 7 x weekly - 1-2 sets - 10 reps  ASSESSMENT:  CLINICAL IMPRESSION: Pt reports 8/10 pain on arrival. Began Nustep and pt reported no pain afterward. PT was completed for STM c MTPR to the L quads c TrPs and taut muscle bands identified. Therex was then completed for strengthening and flexibility of the L quads/LE. Pt reports compliance with trying massage with a rolling pin, tennis ball, and is using moist heat to decrease the pain. Discussed TPDN which the pt is considering. Pt tolerated PT today without adverse effects. Pt will continue to benefit from skilled PT to address impairments for improved L LE function c minimized pain.    EVAL: Patient is a 82 y.o. female who was seen today for physical therapy evaluation and treatment for M19.90 (ICD-10-CM) - Arthritis. Pt presents to PT with complaint of chronic L thigh pain. Pain is provoked by prolonged standing and walking, end range motions for the L hip and knee, resisted L knee ext, and palpation of the L vastus lateralis. Pain appears to be muscular in nature. Pt demonstrates decreased strength of the L hip and knee and has a decreased single leg stance time L vs R. Pt was initiated on a HEP. Pt will benefit from skilled PT 2w6 to address impairments to optimize function with less pain.   OBJECTIVE IMPAIRMENTS: decreased activity tolerance, difficulty walking, decreased  strength, and pain.   ACTIVITY LIMITATIONS: carrying, lifting, bending, standing, squatting, stairs, locomotion level, and caring for others  PARTICIPATION LIMITATIONS: meal prep, cleaning, laundry, and occupation  PERSONAL FACTORS: Age, Fitness, Past/current experiences, and Time since onset of injury/illness/exacerbation are also affecting patient's functional outcome.   REHAB POTENTIAL: Good  CLINICAL DECISION MAKING: Stable/uncomplicated  EVALUATION COMPLEXITY: Low   GOALS:  SHORT TERM GOALS: Target date: 03/18/23 Pt will be Ind in an initial HEP  Baseline: started 03/15/23: Pt reports intermittent compliance  Goal status: ONGOING  2.  Pt will voice understanding of measures to assist in pain reduction  Baseline:  03/15/23: has floor pedals, heat, massager  Goal status: MET  LONG TERM GOALS: Target date: 04/15/23  Pt will be Ind in a final HEP to maintain achieved LOF  Baseline: started Goal status: INITIAL  2.  Increase L hips and knee strength by  MMT grade for improved function  Baseline: see flow sheets Goal status: INITIAL  3.  Increase L single leg  stance to 12" or greater as indication of improved strength and balance Baseline: L 2", R 13" Goal status: INITIAL  4.  Pt will report a 50% or greater improvement with L thigh pain with daily activities for improved function and QOL Baseline: 0-10/10 Goal status: INITIAL  5.  Pt's FOTO score will improved to the predicted value as indication of improved function  Baseline: TBA 03/09/23: 51%  predicted to 64% Goal status: INITIAL  6.  Assess balance per Sharlene Motts and set goal as indicated (MCID 5 pts) Baseline: TBA 03/09/23:  56/56 Goal status: DEFERRED    PLAN:  PT FREQUENCY: 2x/week  PT DURATION: 6 weeks  PLANNED INTERVENTIONS: Therapeutic exercises, Therapeutic activity, Neuromuscular re-education, Balance training, Gait training, Patient/Family education, Self Care, Joint mobilization, Stair training,  Aquatic Therapy, Dry Needling, Cryotherapy, Moist heat, Taping, Ultrasound, Ionotophoresis 4mg /ml Dexamethasone, Manual therapy, and Re-evaluation  PLAN FOR NEXT SESSION: ; assess response to HEP; progress therex as indicated; use of modalities, manual therapy; and TPDN as indicated.  Jolean Madariaga MS, PT 03/17/23 11:45 AM

## 2023-03-17 ENCOUNTER — Ambulatory Visit: Payer: 59

## 2023-03-17 DIAGNOSIS — M6281 Muscle weakness (generalized): Secondary | ICD-10-CM

## 2023-03-17 DIAGNOSIS — R262 Difficulty in walking, not elsewhere classified: Secondary | ICD-10-CM | POA: Diagnosis not present

## 2023-03-17 DIAGNOSIS — M79652 Pain in left thigh: Secondary | ICD-10-CM | POA: Diagnosis not present

## 2023-03-22 ENCOUNTER — Encounter: Payer: Self-pay | Admitting: Physical Therapy

## 2023-03-22 ENCOUNTER — Ambulatory Visit: Payer: 59 | Admitting: Physical Therapy

## 2023-03-22 DIAGNOSIS — R262 Difficulty in walking, not elsewhere classified: Secondary | ICD-10-CM

## 2023-03-22 DIAGNOSIS — M79652 Pain in left thigh: Secondary | ICD-10-CM

## 2023-03-22 DIAGNOSIS — M6281 Muscle weakness (generalized): Secondary | ICD-10-CM | POA: Diagnosis not present

## 2023-03-22 NOTE — Therapy (Signed)
OUTPATIENT PHYSICAL THERAPY LOWER TREATMENT NOTE   Patient Name: Jasmine Vaughan MRN: [id number] DOB:12-02-1940, 82 y.o., female Today's Date: 03/22/2023  END OF SESSION:  PT End of Session - 03/22/23 1016     Visit Number 6    Number of Visits 13    Date for PT Re-Evaluation 04/15/23    Authorization Type [employer] D[employer]PLETE; MEDICAID OF [place]    Authorization - Visit Number 5    Authorization - Number of Visits 27    Progress Note Due on Visit 10    PT Start Time 1015    PT Stop Time 1100    PT Time Calculation (min) 45 min               Past Medical History:  Diagnosis Date   Arthritis    Hypertension    Past Surgical History:  Procedure Laterality Date   BREAST SURGERY     Patient Active Problem List   Diagnosis Date Noted   Personal history of breast cancer 01/05/2023   Uncontrolled hypertension 12/29/2022   Mixed hyperlipidemia 12/29/2022   Osteoarthritis of left hip 12/29/2022   Urinary urgency 12/29/2022   PAC (premature atrial contraction) 12/29/2022    PCP: Marrianne Mood, MD  REFERRING PROVIDER: Gust Rung, DO  REFERRING DIAG: M19.90 (ICD-10-CM) - Arthritis   THERAPY DIAG:  Pain in left thigh  Difficulty in walking, not elsewhere classified  Muscle weakness (generalized)  Rationale for Evaluation and Treatment: Rehabilitation  ONSET DATE: 2-3 years  SUBJECTIVE:   SUBJECTIVE STATEMENT: I got an icy hot patch for my left leg and now I can sleep without pain.    EVAL: Chronic L thigh pain with insidious onset.  Does chair exercises about 1-2x per week.  PERTINENT HISTORY: Arthritis, HTN  PAIN:  Are you having pain? Yes: NPRS scale: 0/10 Pain location: L thigh Pain description: Ache, sharp Aggravating factors: Standing and walking: 20-30 mins Relieving factors: Sitting down Pain range on eval: 1-10/10  PRECAUTIONS: None  RED FLAGS: None   WEIGHT BEARING RESTRICTIONS: No  FALLS:  Has patient fallen in  last 6 months? 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, privately  PLOF: Independent  PATIENT GOALS: Pain relief  NEXT MD VISIT: Pt was not able to recall f/u appt  OBJECTIVE:   DIAGNOSTIC FINDINGS: Order for hip xray in Epic  PATIENT SURVEYS:  FOTO To be completed, wrong folder provided to pt 03/08/23: 51% intake to  64% prediction  COGNITION: Overall cognitive status: Within functional limits for tasks assessed     SENSATION: WFL  EDEMA:  None palpated or observed  MUSCLE LENGTH: Hamstrings: Right WNLs ; Left WNLs  Thomas test: Right tight ; Left tight   POSTURE: rounded shoulders, forward head, and anterior pelvic tilt  PALPATION: TTP of the L vastus lateralis  LOWER EXTREMITY ROM:  WNLS bilat Active ROM Right eval Left eval Right 03/15/23   Hip flexion     Hip extension     Hip abduction     Hip adduction     Hip internal rotation   thigh pain at end range   Hip external rotation  thigh pain at end range   Knee flexion  thigh pain at end range 140  Knee extension     Ankle dorsiflexion  Ankle plantarflexion     Ankle inversion     Ankle eversion      (Blank rows = not tested)  LOWER EXTREMITY MMT:  MMT Right eval Left eval Left  03/22/23  Hip flexion 4 3+ 4  Hip extension 3+ 3 3+  Hip abduction 4 3+ 4  Hip adduction     Hip internal rotation     Hip external rotation 4 3+ 4  Knee flexion 4+ 4+   Knee extension 4+ 4 pain   Ankle dorsiflexion     Ankle plantarflexion     Ankle inversion     Ankle eversion      (Blank rows = not tested)  LOWER EXTREMITY SPECIAL TESTS:  Hip special tests: Luisa Hart (FABER) test: negative, SI compression test: negative, and Hip scouring test: negative  FUNCTIONAL TESTS:  SLS  L = 6", R = 13" 03/09/23:FOTO: taken on 3rd visit:  51% predicted to 64%  GAIT: Distance walked: 200' Assistive device utilized: None Level of assistance: Complete Independence Comments: gait pattern WNLs   TODAY'S TREATMENT:    [facility] Adult PT Treatment:                                                DATE: 03/22/23 Therapeutic Exercise: Prone hip ext with bent knee  Figure 4 bridge x 10 each  Figure 4 stretch left  Hip flexor stretch left  STS x 10  STS 10# x 10   Neuromuscular re-ed: SLS 8 sec left and right Therapeutic Activity: MMT   [facility] Adult PT Treatment:                                                DATE: 03/17/23 Therapeutic Exercise: Nustep 5 mins L4 UE/LE LAQ x10 5" c ball squeeze QS x10 5" SLR with ER 2x10 Ball squeeze with SAQ x10  Bridge with ball squeeze  Supine clam x10 GTB Hip flexor stretch EOM Manual Therapy: STM and MTPR to the L quad- rectus femoris and vastus lateralis  [facility] Adult PT Treatment:                                                DATE: 03/15/23 Therapeutic Exercise: Nustep  LAQ QS SLR with ER x 10 Ball squeeze with SAQ Left x8  Bridge with ball squeeze  Hip flexor stretch EOM Supine clam GTB  Self Care: Tennis ball vs massage roller to left vastus lateralis    [facility] Adult PT Treatment:                                                DATE: 03/09/23 Therapeutic Exercise: Qs 5 sec x 10 QS with SLR Heel slides left GTB clam supine with ab draw in Banded Bridge   Self Care: Self massage roller to thigh Therapeutic Activity: BERG  56/56   Modalities:  HMP left thigh x 10 minutes at end of session  PATIENT EDUCATION:  Education details: Eval findings, POC, HEP  Person educated: Patient Education method: Explanation, Demonstration, Tactile cues, Verbal cues, and Handouts Education comprehension: verbalized understanding, returned demonstration, verbal cues required, tactile cues  required, and needs further education  HOME EXERCISE PROGRAM: Access Code: [email or web address] URL: [email or web address] Date: 03/08/2023 Prepared by: Joellyn Rued  Exercises - Supine Quad Set  - 1 x daily - 7 x weekly - 2 sets - 10 reps - 5 hold - Active Straight Leg Raise with Quad Set  - 1 x daily - 7 x weekly - 2 sets - 10 reps - 3 hold - Supine Bridge with Resistance Band  - 1 x daily - 7 x weekly - 2 sets - 10 reps - 3 hold - Hooklying Clamshell with Resistance  - 1 x daily - 7 x weekly - 2 sets - 10 reps - 3 hold  - Modified Thomas Stretch  - 1 x daily - 7 x weekly - 1 sets - 30 reps - 30 hold - Straight Leg Raise with External Rotation  - 1 x daily - 7 x weekly - 1-2 sets - 10 reps  - Figure 4 Bridge  - 1 x daily - 7 x weekly - 1-2 sets - 10 reps - Sit-stand with reach  - 1 x daily - 7 x weekly - 1-2 sets - 10 reps  ASSESSMENT:  CLINICAL IMPRESSION: Pt reports 0/10 pain on arrival. She reports new stick on icy hot patch has been helpful for sleeping at night. Left hip MMT improved although weakness still present left hip extension as compared to right. Increased hip extension strengthening today and updated HEP. SLS time improved.  Pt tolerated PT today without adverse effects. Pt will continue to benefit from skilled PT to address impairments for improved L LE function c minimized pain.    EVAL: Patient is a 82 y.o. female who was seen today for physical therapy evaluation and treatment for M19.90 (ICD-10-CM) - Arthritis. Pt presents to PT with complaint of chronic L thigh pain. Pain is provoked by prolonged standing and walking, end range motions for the L hip and knee, resisted L knee ext, and palpation of the L vastus lateralis. Pain appears to be muscular in nature. Pt demonstrates decreased strength of the L hip and knee and has a decreased single leg stance time L vs R. Pt was initiated on a HEP. Pt will benefit from skilled PT 2w6 to address impairments to optimize  function with less pain.   OBJECTIVE IMPAIRMENTS: decreased activity tolerance, difficulty walking, decreased strength, and pain.   ACTIVITY LIMITATIONS: carrying, lifting, bending, standing, squatting, stairs, locomotion level, and caring for others  PARTICIPATION LIMITATIONS: meal prep, cleaning, laundry, and occupation  PERSONAL FACTORS: Age, Fitness, Past/current experiences, and Time since onset of injury/illness/exacerbation are also affecting patient's functional outcome.   REHAB POTENTIAL: Good  CLINICAL DECISION MAKING: Stable/uncomplicated  EVALUATION COMPLEXITY: Low   GOALS:  SHORT TERM GOALS: Target date: 03/18/23 Pt will be Ind in an initial HEP  Baseline: started 03/15/23: Pt reports intermittent compliance  Goal status: ONGOING  2.  Pt will voice understanding of measures to assist in pain reduction  Baseline:  03/15/23: has floor pedals, heat, massager  Goal status: MET  LONG TERM GOALS: Target date: 04/15/23  Pt will be Ind in a final HEP to maintain achieved LOF  Baseline: started Goal status: INITIAL  2.  Increase L hips and knee strength by  MMT grade for improved function  Baseline: see flow sheets Goal status: INITIAL  3.  Increase L single leg stance to 12" or greater as indication of improved strength and balance Baseline: L 2", R 13" 03/22/23: L 8" Goal status: ONGOING   4.  Pt will report a 50% or greater improvement with L thigh pain with daily activities for improved function and QOL Baseline: 0-10/10 Goal status: INITIAL  5.  Pt's FOTO score will improved to the predicted value as indication of improved function  Baseline: TBA 03/09/23: 51%  predicted to 64% Goal status: INITIAL  6.  Assess balance per Sharlene Motts and set goal as indicated (MCID 5 pts) Baseline: TBA 03/09/23:  56/56 Goal status: DEFERRED    PLAN:  PT FREQUENCY: 2x/week  PT DURATION: 6 weeks  PLANNED INTERVENTIONS: Therapeutic exercises, Therapeutic activity,  Neuromuscular re-education, Balance training, Gait training, Patient/Family education, Self Care, Joint mobilization, Stair training, Aquatic Therapy, Dry Needling, Cryotherapy, Moist heat, Taping, Ultrasound, Ionotophoresis 4mg /ml Dexamethasone, Manual therapy, and Re-evaluation  PLAN FOR NEXT SESSION: ; assess response to HEP; progress therex as indicated; use of modalities, manual therapy; and TPDN as indicated.  Jannette Spanner, PTA 03/22/23 11:03 AM Phone: [phone] Fax: [phone]

## 2023-03-24 ENCOUNTER — Ambulatory Visit: Payer: 59 | Admitting: Physical Therapy

## 2023-03-24 ENCOUNTER — Encounter: Payer: Self-pay | Admitting: Physical Therapy

## 2023-03-24 DIAGNOSIS — M6281 Muscle weakness (generalized): Secondary | ICD-10-CM

## 2023-03-24 DIAGNOSIS — R262 Difficulty in walking, not elsewhere classified: Secondary | ICD-10-CM | POA: Diagnosis not present

## 2023-03-24 DIAGNOSIS — M79652 Pain in left thigh: Secondary | ICD-10-CM | POA: Diagnosis not present

## 2023-03-24 NOTE — Therapy (Signed)
OUTPATIENT PHYSICAL THERAPY LOWER TREATMENT NOTE   Patient Name: Jasmine Vaughan MRN: [id number] DOB:1940-10-03, 82 y.o., female Today's Date: 03/24/2023  END OF SESSION:  PT End of Session - 03/24/23 1048     Visit Number 7    Number of Visits 13    Date for PT Re-Evaluation 04/15/23    Authorization Type [employer] DUAL COMPLETE; MEDICAID OF [place]    Authorization - Visit Number 6    Authorization - Number of Visits 27    PT Start Time 1100    PT Stop Time 1140    PT Time Calculation (min) 40 min               Past Medical History:  Diagnosis Date   Arthritis    Hypertension    Past Surgical History:  Procedure Laterality Date   BREAST SURGERY     Patient Active Problem List   Diagnosis Date Noted   Personal history of breast cancer 01/05/2023   Uncontrolled hypertension 12/29/2022   Mixed hyperlipidemia 12/29/2022   Osteoarthritis of left hip 12/29/2022   Urinary urgency 12/29/2022   PAC (premature atrial contraction) 12/29/2022    PCP: Marrianne Mood, MD  REFERRING PROVIDER: Gust Rung, DO  REFERRING DIAG: M19.90 (ICD-10-CM) - Arthritis   THERAPY DIAG:  Pain in left thigh  Difficulty in walking, not elsewhere classified  Muscle weakness (generalized)  Rationale for Evaluation and Treatment: Rehabilitation  ONSET DATE: 2-3 years  SUBJECTIVE:   SUBJECTIVE STATEMENT: I did not wear the icy hot patch last night. It was different and I like the icy hot better.    EVAL: Chronic L thigh pain with insidious onset.  Does chair exercises about 1-2x per week.  PERTINENT HISTORY: Arthritis, HTN  PAIN:  Are you having pain? Yes: NPRS scale: 4-5/10 Pain location: L thigh Pain description: Ache, sharp Aggravating factors: Standing and walking: 20-30 mins Relieving factors: Sitting down Pain range on eval: 1-10/10  PRECAUTIONS: None  RED FLAGS: None   WEIGHT BEARING RESTRICTIONS: No  FALLS:  Has patient fallen in last 6  months? 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, privately  PLOF: Independent  PATIENT GOALS: Pain relief  NEXT MD VISIT: Pt was not able to recall f/u appt  OBJECTIVE:   DIAGNOSTIC FINDINGS: Order for hip xray in Epic  PATIENT SURVEYS:  FOTO To be completed, wrong folder provided to pt 03/08/23: 51% intake to  64% prediction  COGNITION: Overall cognitive status: Within functional limits for tasks assessed     SENSATION: WFL  EDEMA:  None palpated or observed  MUSCLE LENGTH: Hamstrings: Right WNLs ; Left WNLs  Thomas test: Right tight ; Left tight   POSTURE: rounded shoulders, forward head, and anterior pelvic tilt  PALPATION: TTP of the L vastus lateralis  LOWER EXTREMITY ROM:  WNLS bilat Active ROM Right eval Left eval Right 03/15/23   Hip flexion     Hip extension     Hip abduction     Hip adduction     Hip internal rotation   thigh pain at end range   Hip external rotation  thigh pain at end range   Knee flexion  thigh pain at end range 140  Knee extension     Ankle dorsiflexion     Ankle plantarflexion  Ankle inversion     Ankle eversion      (Blank rows = not tested)  LOWER EXTREMITY MMT:  MMT Right eval Left eval Left  03/22/23  Hip flexion 4 3+ 4  Hip extension 3+ 3 3+  Hip abduction 4 3+ 4  Hip adduction     Hip internal rotation     Hip external rotation 4 3+ 4  Knee flexion 4+ 4+   Knee extension 4+ 4 pain   Ankle dorsiflexion     Ankle plantarflexion     Ankle inversion     Ankle eversion      (Blank rows = not tested)  LOWER EXTREMITY SPECIAL TESTS:  Hip special tests: Luisa Hart (FABER) test: negative, SI compression test: negative, and Hip scouring test: negative  FUNCTIONAL TESTS:  SLS  L = 6", R = 13" 03/09/23:FOTO: taken on 3rd visit: 51%  predicted to 64%  GAIT: Distance walked: 200' Assistive device utilized: None Level of assistance: Complete Independence Comments: gait pattern WNLs   TODAY'S TREATMENT:    [facility] Adult PT Treatment:                                                DATE: 03/24/23 Therapeutic Exercise: STS STS 10# 8 inch step up with march Lunge at counter 5 x 2 each - mod cues for form L SLR with ER  Figure 4 bridge Prone knee flex/ hip ext Qped on forearms donkey kicks alternating  EOM hamstring stretch  Self Care TPDN further discussed and information sheets given for review    [facility] Adult PT Treatment:                                                DATE: 03/22/23 Therapeutic Exercise: Prone hip ext with bent knee  Figure 4 bridge x 10 each  Figure 4 stretch left  Hip flexor stretch left  STS x 10  STS 10# x 10   Neuromuscular re-ed: SLS 8 sec left and right Therapeutic Activity: MMT   [facility] Adult PT Treatment:                                                DATE: 03/17/23 Therapeutic Exercise: Nustep 5 mins L4 UE/LE LAQ x10 5" c ball squeeze QS x10 5" SLR with ER 2x10 Ball squeeze with SAQ x10  Bridge with ball squeeze  Supine clam x10 GTB Hip flexor stretch EOM Manual Therapy: STM and MTPR to the L quad- rectus femoris and vastus lateralis  [facility] Adult PT Treatment:                                                DATE: 03/15/23 Therapeutic Exercise: Nustep  LAQ QS SLR with ER x 10 Ball squeeze with SAQ Left x8  Bridge with ball squeeze  Hip flexor stretch EOM Supine clam GTB  Self Care: Tennis ball vs massage roller to left vastus lateralis  PATIENT EDUCATION:  Education details: Eval findings, POC, HEP  Person educated: Patient Education method: Explanation, Demonstration, Tactile cues, Verbal cues, and Handouts Education comprehension: verbalized understanding,  returned demonstration, verbal cues required, tactile cues required, and needs further education  HOME EXERCISE PROGRAM: Access Code: [email or web address] URL: [email or web address] Date: 03/08/2023 Prepared by: Joellyn Rued  Exercises - Supine Quad Set  - 1 x daily - 7 x weekly - 2 sets - 10 reps - 5 hold - Active Straight Leg Raise with Quad Set  - 1 x daily - 7 x weekly - 2 sets - 10 reps - 3 hold - Supine Bridge with Resistance Band  - 1 x daily - 7 x weekly - 2 sets - 10 reps - 3 hold - Hooklying Clamshell with Resistance  - 1 x daily - 7 x weekly - 2 sets - 10 reps - 3 hold  - Modified Thomas Stretch  - 1 x daily - 7 x weekly - 1 sets - 30 reps - 30 hold - Straight Leg Raise with External Rotation  - 1 x daily - 7 x weekly - 1-2 sets - 10 reps  - Figure 4 Bridge  - 1 x daily - 7 x weekly - 1-2 sets - 10 reps - Sit-stand with reach  - 1 x daily - 7 x weekly - 1-2 sets - 10 reps  ASSESSMENT:  CLINICAL IMPRESSION: Pt reports 4-5/10 pain on arrival. Progressed closed chain and functional strengthening with good tolerance. Pt given additional info on TPDN for her to review with her daughter. She continues to report decrease pain when wearing icy hot patch.  Check FOTO next. TPDN is pt has made a decision.  Pt will continue to benefit from skilled PT to address impairments for improved L LE function c minimized pain.    EVAL: Patient is a 82 y.o. female who was seen today for physical therapy evaluation and treatment for M19.90 (ICD-10-CM) - Arthritis. Pt presents to PT with complaint of chronic L thigh pain. Pain is provoked by prolonged standing and walking, end range motions for the L hip and knee, resisted L knee ext, and palpation of the L vastus lateralis. Pain appears to be muscular in nature. Pt demonstrates decreased strength of the L hip and knee and has a decreased single leg stance time L vs R. Pt was initiated on a HEP. Pt will benefit from skilled PT 2w6 to address  impairments to optimize function with less pain.   OBJECTIVE IMPAIRMENTS: decreased activity tolerance, difficulty walking, decreased strength, and pain.   ACTIVITY LIMITATIONS: carrying, lifting, bending, standing, squatting, stairs, locomotion level, and caring for others  PARTICIPATION LIMITATIONS: meal prep, cleaning, laundry, and occupation  PERSONAL FACTORS: Age, Fitness, Past/current experiences, and Time since onset of injury/illness/exacerbation are also affecting patient's functional outcome.   REHAB POTENTIAL: Good  CLINICAL DECISION MAKING: Stable/uncomplicated  EVALUATION COMPLEXITY: Low   GOALS:  SHORT TERM GOALS: Target date: 03/18/23 Pt will be Ind in an initial HEP  Baseline: started 03/15/23: Pt reports intermittent compliance  Goal status: ONGOING  2.  Pt will voice understanding of measures to assist in pain reduction  Baseline:  03/15/23: has floor pedals, heat, massager  Goal status: MET  LONG TERM GOALS: Target date: 04/15/23  Pt will be Ind in a final HEP to maintain achieved LOF  Baseline: started Goal status: INITIAL  2.  Increase L hips and knee strength by  MMT grade for improved function  Baseline: see flow  sheets Goal status: ONGOING  3.  Increase L single leg stance to 12" or greater as indication of improved strength and balance Baseline: L 2", R 13" 03/22/23: L 8" Goal status: ONGOING   4.  Pt will report a 50% or greater improvement with L thigh pain with daily activities for improved function and QOL Baseline: 0-10/10 Goal status: INITIAL  5.  Pt's FOTO score will improved to the predicted value as indication of improved function  Baseline: TBA 03/09/23: 51%  predicted to 64% Goal status: INITIAL  6.  Assess balance per Sharlene Motts and set goal as indicated (MCID 5 pts) Baseline: TBA 03/09/23:  56/56 Goal status: DEFERRED    PLAN:  PT FREQUENCY: 2x/week  PT DURATION: 6 weeks  PLANNED INTERVENTIONS: Therapeutic exercises,  Therapeutic activity, Neuromuscular re-education, Balance training, Gait training, Patient/Family education, Self Care, Joint mobilization, Stair training, Aquatic Therapy, Dry Needling, Cryotherapy, Moist heat, Taping, Ultrasound, Ionotophoresis 4mg /ml Dexamethasone, Manual therapy, and Re-evaluation  PLAN FOR NEXT SESSION: ; NEED FOTO status: assess response to HEP; progress therex as indicated; use of modalities, manual therapy; and TPDN as indicated.  Jannette Spanner, PTA 03/24/23 11:45 AM Phone: [phone] Fax: [phone]

## 2023-03-24 NOTE — Patient Instructions (Addendum)

## 2023-04-05 NOTE — Therapy (Signed)
OUTPATIENT PHYSICAL THERAPY LOWER TREATMENT NOTE   Patient Name: Jasmine Vaughan MRN: [id number] DOB:[birth year], 82 y.o., female Today's Date: 04/06/2023  END OF SESSION:  PT End of Session - 04/06/23 1022     Visit Number 8    Number of Visits 13    Date for PT Re-Evaluation 04/15/23    Authorization Type [employer] DUAL COMPLETE; MEDICAID OF     Authorization - Visit Number 7    Authorization - Number of Visits 27    Progress Note Due on Visit 10    PT Start Time 1020    PT Stop Time 1058    PT Time Calculation (min) 38 min    Activity Tolerance Patient tolerated treatment well    Behavior During Therapy WFL for tasks assessed/performed                Past Medical History:  Diagnosis Date   Arthritis    Hypertension    Past Surgical History:  Procedure Laterality Date   BREAST SURGERY     Patient Active Problem List   Diagnosis Date Noted   Personal history of breast cancer 01/05/2023   Uncontrolled hypertension 12/29/2022   Mixed hyperlipidemia 12/29/2022   Osteoarthritis of left hip 12/29/2022   Urinary urgency 12/29/2022   PAC (premature atrial contraction) 12/29/2022    PCP: Marrianne Mood, MD  REFERRING PROVIDER: Gust Rung, DO  REFERRING DIAG: M19.90 (ICD-10-CM) - Arthritis   THERAPY DIAG:  Pain in left thigh  Difficulty in walking, not elsewhere classified  Muscle weakness (generalized)  Rationale for Evaluation and Treatment: Rehabilitation  ONSET DATE: 2-3 years  SUBJECTIVE:   SUBJECTIVE STATEMENT: Pt reports her L thigh has been bothering her more. Pt states she would like to try TPDN her next visit.   EVAL: Chronic L thigh pain with insidious onset.  Does chair exercises about 1-2x per week.  PERTINENT HISTORY: Arthritis, HTN  PAIN:  Are you having pain? Yes: NPRS scale:10/10 Pain location: L thigh Pain description: Ache, sharp Aggravating factors: Standing and walking: 20-30 mins Relieving factors:  Sitting down Pain range on eval: 1-10/10  PRECAUTIONS: None  RED FLAGS: None   WEIGHT BEARING RESTRICTIONS: No  FALLS:  Has patient fallen in last 6 months? 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, privately  PLOF: Independent  PATIENT GOALS: Pain relief  NEXT MD VISIT: Pt was not able to recall f/u appt  OBJECTIVE:   DIAGNOSTIC FINDINGS: Order for hip xray in Epic  PATIENT SURVEYS:  FOTO To be completed, wrong folder provided to pt 03/08/23: 51% intake to  64% prediction  COGNITION: Overall cognitive status: Within functional limits for tasks assessed     SENSATION: WFL  EDEMA:  None palpated or observed  MUSCLE LENGTH: Hamstrings: Right WNLs ; Left WNLs  Thomas test: Right tight ; Left tight   POSTURE: rounded shoulders, forward head, and anterior pelvic tilt  PALPATION: TTP of the L vastus lateralis  LOWER EXTREMITY ROM:  WNLS bilat Active ROM Right eval Left eval Right 03/15/23   Hip flexion     Hip extension     Hip abduction     Hip adduction     Hip internal rotation   thigh pain at end range   Hip external rotation  thigh pain at end range  Knee flexion  thigh pain at end range 140  Knee extension     Ankle dorsiflexion     Ankle plantarflexion     Ankle inversion     Ankle eversion      (Blank rows = not tested)  LOWER EXTREMITY MMT:  MMT Right eval Left eval Left  03/22/23  Hip flexion 4 3+ 4  Hip extension 3+ 3 3+  Hip abduction 4 3+ 4  Hip adduction     Hip internal rotation     Hip external rotation 4 3+ 4  Knee flexion 4+ 4+   Knee extension 4+ 4 pain   Ankle dorsiflexion     Ankle plantarflexion     Ankle inversion     Ankle eversion      (Blank rows = not tested)  LOWER EXTREMITY SPECIAL TESTS:  Hip special tests: Luisa Hart (FABER) test:  negative, SI compression test: negative, and Hip scouring test: negative  FUNCTIONAL TESTS:  SLS  L = 6", R = 13" 03/09/23:FOTO: taken on 3rd visit: 51% predicted to 64%  GAIT: Distance walked: 200' Assistive device utilized: None Level of assistance: Complete Independence Comments: gait pattern WNLs   TODAY'S TREATMENT:    [facility] Adult PT Treatment:                                                DATE: 04/06/23 Therapeutic Exercise: Nustep 5 min L4 UE/LE L SLR with ER x10 Bridging x10 H/L clam GTB 2x10 LAQ 2x10 4# STS x10 STS 10# Manual Therapy: STN c MTPR to the L quad Self Care: Provided info sheet on TPDN  [facility] Adult PT Treatment:                                                DATE: 03/24/23 Therapeutic Exercise: STS STS 10# 8 inch step up with march Lunge at counter 5 x 2 each - mod cues for form L SLR with ER  Figure 4 bridge Prone knee flex/ hip ext Qped on forearms donkey kicks alternating  EOM hamstring stretch  Self Care TPDN further discussed and information sheets given for review  [facility] Adult PT Treatment:                                                DATE: 03/22/23 Therapeutic Exercise: Prone hip ext with bent knee  Figure 4 bridge x 10 each  Figure 4 stretch left  Hip flexor stretch left  STS x 10  STS 10# x 10   Neuromuscular re-ed: SLS 8 sec left and right Therapeutic Activity: MMT                                                                         PATIENT EDUCATION:  Education details: Eval findings, POC, HEP  Person  educated: Patient Education method: Explanation, Demonstration, Tactile cues, Verbal cues, and Handouts Education comprehension: verbalized understanding, returned demonstration, verbal cues required, tactile cues required, and needs further education  HOME EXERCISE PROGRAM: Access Code: [email or web address] URL: [email or web address] Date: 03/08/2023 Prepared by: Joellyn Rued  Exercises - Supine Quad Set  - 1 x daily  - 7 x weekly - 2 sets - 10 reps - 5 hold - Active Straight Leg Raise with Quad Set  - 1 x daily - 7 x weekly - 2 sets - 10 reps - 3 hold - Supine Bridge with Resistance Band  - 1 x daily - 7 x weekly - 2 sets - 10 reps - 3 hold - Hooklying Clamshell with Resistance  - 1 x daily - 7 x weekly - 2 sets - 10 reps - 3 hold  - Modified Thomas Stretch  - 1 x daily - 7 x weekly - 1 sets - 30 reps - 30 hold - Straight Leg Raise with External Rotation  - 1 x daily - 7 x weekly - 1-2 sets - 10 reps  - Figure 4 Bridge  - 1 x daily - 7 x weekly - 1-2 sets - 10 reps - Sit-stand with reach  - 1 x daily - 7 x weekly - 1-2 sets - 10 reps  ASSESSMENT:  CLINICAL IMPRESSION: Pt arrived with increased L thigh pain today. Pt is interested in TPDN, but wants to wait until the next visit. PT was completed to [facility] c MTPR for taut muscle bands of the L quad f/b strengthening therex for muscle activation. Following the session, pt reported her L thigh pain was resolved. Possible use of TPDN the next PT session. Pt will continue to benefit from skilled PT to address impairments for improved L LE function c minimized pain.    EVAL: Patient is a 82 y.o. female who was seen today for physical therapy evaluation and treatment for M19.90 (ICD-10-CM) - Arthritis. Pt presents to PT with complaint of chronic L thigh pain. Pain is provoked by prolonged standing and walking, end range motions for the L hip and knee, resisted L knee ext, and palpation of the L vastus lateralis. Pain appears to be muscular in nature. Pt demonstrates decreased strength of the L hip and knee and has a decreased single leg stance time L vs R. Pt was initiated on a HEP. Pt will benefit from skilled PT 2w6 to address impairments to optimize function with less pain.   OBJECTIVE IMPAIRMENTS: decreased activity tolerance, difficulty walking, decreased strength, and pain.   ACTIVITY LIMITATIONS: carrying, lifting, bending, standing, squatting, stairs, locomotion  level, and caring for others  PARTICIPATION LIMITATIONS: meal prep, cleaning, laundry, and occupation  PERSONAL FACTORS: Age, Fitness, Past/current experiences, and Time since onset of injury/illness/exacerbation are also affecting patient's functional outcome.   REHAB POTENTIAL: Good  CLINICAL DECISION MAKING: Stable/uncomplicated  EVALUATION COMPLEXITY: Low   GOALS:  SHORT TERM GOALS: Target date: 03/18/23 Pt will be Ind in an initial HEP  Baseline: started 03/15/23: Pt reports intermittent compliance  Goal status: ONGOING  2.  Pt will voice understanding of measures to assist in pain reduction  Baseline:  03/15/23: has floor pedals, heat, massager  Goal status: MET  LONG TERM GOALS: Target date: 04/15/23  Pt will be Ind in a final HEP to maintain achieved LOF  Baseline: started Goal status: INITIAL  2.  Increase L hips and knee strength by  MMT grade for improved function  Baseline: see flow sheets  Goal status: ONGOING  3.  Increase L single leg stance to 12" or greater as indication of improved strength and balance Baseline: L 2", R 13" 03/22/23: L 8" Goal status: ONGOING   4.  Pt will report a 50% or greater improvement with L thigh pain with daily activities for improved function and QOL Baseline: 0-10/10 Goal status: INITIAL  5.  Pt's FOTO score will improved to the predicted value as indication of improved function  Baseline: TBA 03/09/23: 51%  predicted to 64% Goal status: INITIAL  6.  Assess balance per Sharlene Motts and set goal as indicated (MCID 5 pts) Baseline: TBA 03/09/23:  56/56 Goal status: DEFERRED    PLAN:  PT FREQUENCY: 2x/week  PT DURATION: 6 weeks  PLANNED INTERVENTIONS: Therapeutic exercises, Therapeutic activity, Neuromuscular re-education, Balance training, Gait training, Patient/Family education, Self Care, Joint mobilization, Stair training, Aquatic Therapy, Dry Needling, Cryotherapy, Moist heat, Taping, Ultrasound, Ionotophoresis 4mg /ml  Dexamethasone, Manual therapy, and Re-evaluation  PLAN FOR NEXT SESSION: ; NEED FOTO status: assess response to HEP; progress therex as indicated; use of modalities, manual therapy; and TPDN as indicated.  Moe Brier MS, PT 04/06/23 10:59 AM

## 2023-04-06 ENCOUNTER — Ambulatory Visit: Payer: 59

## 2023-04-06 DIAGNOSIS — R262 Difficulty in walking, not elsewhere classified: Secondary | ICD-10-CM

## 2023-04-06 DIAGNOSIS — M79652 Pain in left thigh: Secondary | ICD-10-CM | POA: Diagnosis not present

## 2023-04-06 DIAGNOSIS — M6281 Muscle weakness (generalized): Secondary | ICD-10-CM | POA: Diagnosis not present

## 2023-04-07 NOTE — Therapy (Signed)
OUTPATIENT PHYSICAL THERAPY LOWER TREATMENT NOTE   Patient Name: Jasmine Vaughan MRN: [id number] DOB:1941-01-27, 82 y.o., female Today's Date: 04/08/2023  END OF SESSION:  PT End of Session - 04/08/23 1112     Visit Number 9    Number of Visits 13    Date for PT Re-Evaluation 04/15/23    Authorization Type [employer] DUAL COMPLETE; MEDICAID OF [place]    Authorization - Visit Number 8    Authorization - Number of Visits 27    Progress Note Due on Visit 10    PT Start Time 1106    PT Stop Time 1150    PT Time Calculation (min) 44 min    Activity Tolerance Patient tolerated treatment well    Behavior During Therapy WFL for tasks assessed/performed                 Past Medical History:  Diagnosis Date   Arthritis    Hypertension    Past Surgical History:  Procedure Laterality Date   BREAST SURGERY     Patient Active Problem List   Diagnosis Date Noted   Personal history of breast cancer 01/05/2023   Uncontrolled hypertension 12/29/2022   Mixed hyperlipidemia 12/29/2022   Osteoarthritis of left hip 12/29/2022   Urinary urgency 12/29/2022   PAC (premature atrial contraction) 12/29/2022    PCP: Marrianne Mood, MD  REFERRING PROVIDER: Gust Rung, DO  REFERRING DIAG: M19.90 (ICD-10-CM) - Arthritis   THERAPY DIAG:  Pain in left thigh  Difficulty in walking, not elsewhere classified  Muscle weakness (generalized)  Rationale for Evaluation and Treatment: Rehabilitation  ONSET DATE: 2-3 years  SUBJECTIVE:   SUBJECTIVE STATEMENT: Pt reports her L thigh pain is medium today.   EVAL: Chronic L thigh pain with insidious onset.  Does chair exercises about 1-2x per week.  PERTINENT HISTORY: Arthritis, HTN  PAIN:  Are you having pain? Yes: NPRS scale:5/10 Pain location: L thigh Pain description: Ache, sharp Aggravating factors: Standing and walking: 20-30 mins Relieving factors: Sitting down Pain range on eval: 1-10/10  PRECAUTIONS:  None  RED FLAGS: None   WEIGHT BEARING RESTRICTIONS: No  FALLS:  Has patient fallen in last 6 months? 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, privately  PLOF: Independent  PATIENT GOALS: Pain relief  NEXT MD VISIT: Pt was not able to recall f/u appt  OBJECTIVE:   DIAGNOSTIC FINDINGS: Order for hip xray in Epic  PATIENT SURVEYS:  FOTO To be completed, wrong folder provided to pt 03/08/23: 51% intake to  64% prediction  COGNITION: Overall cognitive status: Within functional limits for tasks assessed     SENSATION: WFL  EDEMA:  None palpated or observed  MUSCLE LENGTH: Hamstrings: Right WNLs ; Left WNLs  Thomas test: Right tight ; Left tight   POSTURE: rounded shoulders, forward head, and anterior pelvic tilt  PALPATION: TTP of the L vastus lateralis  LOWER EXTREMITY ROM:  WNLS bilat Active ROM Right eval Left eval Right 03/15/23   Hip flexion     Hip extension     Hip abduction     Hip adduction     Hip internal rotation   thigh pain at end range   Hip external rotation  thigh pain at end range   Knee flexion  thigh pain at end range 140  Knee extension     Ankle dorsiflexion     Ankle plantarflexion     Ankle inversion     Ankle eversion      (Blank rows = not tested)  LOWER EXTREMITY MMT:  MMT Right eval Left eval Left  03/22/23  Hip flexion 4 3+ 4  Hip extension 3+ 3 3+  Hip abduction 4 3+ 4  Hip adduction     Hip internal rotation     Hip external rotation 4 3+ 4  Knee flexion 4+ 4+   Knee extension 4+ 4 pain   Ankle dorsiflexion     Ankle plantarflexion     Ankle inversion     Ankle eversion      (Blank rows = not tested)  LOWER EXTREMITY SPECIAL TESTS:  Hip special tests: Luisa Hart (FABER) test: negative, SI compression test: negative, and Hip scouring  test: negative  FUNCTIONAL TESTS:  SLS  L = 6", R = 13" 03/09/23:FOTO: taken on 3rd visit: 51% predicted to 64%  GAIT: Distance walked: 200' Assistive device utilized: None Level of assistance: Complete Independence Comments: gait pattern WNLs   TODAY'S TREATMENT:  [facility] Adult PT Treatment:                                                DATE: 04/08/23 Therapeutic Exercise: Nustep 5 min L4 UE/LE L SLR with ER x10 Bridging x10 H/L clam GTB 2x10 LAQ 2x10 4# STS x10 STS 10# Manual Therapy: STM to the L quad Skilled palpation to the L quad to identify TrPs and taut muscle bands Trigger Point Dry Needling Treatment: Pre-treatment instruction: Patient instructed on dry needling rationale, procedures, and possible side effects including pain during treatment (achy,cramping feeling), bruising, drop of blood, lightheadedness, nausea, sweating. Patient Consent Given: No Education handout provided: Yes Muscles treated: L rectus femoris and vastus lateralis  Needle size and number: .30x15mm x 1 Electrical stimulation performed: No Parameters: N/A Treatment response/outcome: Twitch response elicited Post-treatment instructions: Patient instructed to expect possible mild to moderate muscle soreness later today and/or tomorrow. Patient instructed in methods to reduce muscle soreness and to continue prescribed HEP. If patient was dry needled over the lung field, patient was instructed on signs and symptoms of pneumothorax and, however unlikely, to see immediate medical attention should they occur. Patient was also educated on signs and symptoms of infection and to seek medical attention should they occur. Patient verbalized understanding of these instructions and education.     [facility] Adult PT Treatment:                                                DATE: 04/06/23 Therapeutic Exercise: Nustep 5 min L4 UE/LE L SLR with ER x10 Bridging x10 H/L clam GTB 2x10 LAQ 2x10 c ball squeeze 4# STS  x10 Manual Therapy: STM to the L quad Self Care: Provided info sheet on TPDN  [facility] Adult PT Treatment:                                                DATE: 03/24/23 Therapeutic Exercise:  STS STS 10# 8 inch step up with march Lunge at counter 5 x 2 each - mod cues for form L SLR with ER  Figure 4 bridge Prone knee flex/ hip ext Qped on forearms donkey kicks alternating  EOM hamstring stretch  Self Care TPDN further discussed and information sheets given for review                                                                         PATIENT EDUCATION:  Education details: Eval findings, POC, HEP  Person educated: Patient Education method: Explanation, Demonstration, Tactile cues, Verbal cues, and Handouts Education comprehension: verbalized understanding, returned demonstration, verbal cues required, tactile cues required, and needs further education  HOME EXERCISE PROGRAM: Access Code: [email or web address] URL: [email or web address] Date: 03/08/2023 Prepared by: Joellyn Rued  Exercises - Supine Quad Set  - 1 x daily - 7 x weekly - 2 sets - 10 reps - 5 hold - Active Straight Leg Raise with Quad Set  - 1 x daily - 7 x weekly - 2 sets - 10 reps - 3 hold - Supine Bridge with Resistance Band  - 1 x daily - 7 x weekly - 2 sets - 10 reps - 3 hold - Hooklying Clamshell with Resistance  - 1 x daily - 7 x weekly - 2 sets - 10 reps - 3 hold  - Modified Thomas Stretch  - 1 x daily - 7 x weekly - 1 sets - 30 reps - 30 hold - Straight Leg Raise with External Rotation  - 1 x daily - 7 x weekly - 1-2 sets - 10 reps  - Figure 4 Bridge  - 1 x daily - 7 x weekly - 1-2 sets - 10 reps - Sit-stand with reach  - 1 x daily - 7 x weekly - 1-2 sets - 10 reps  ASSESSMENT:  CLINICAL IMPRESSION: PT ws completed for manual therapy as noted above f/b TPDN to the L rectus femoris and vastus lateralis muscles. Pt then completed therex for L LE strengthening for muscle activation. Pt reported the  anticipated soreness of her thigh following the TPDN, but otherwise she tolerated the PT session without adverse effects. Will assess pt's full response to the TPDN the next PT session.  EVAL: Patient is a 82 y.o. female who was seen today for physical therapy evaluation and treatment for M19.90 (ICD-10-CM) - Arthritis. Pt presents to PT with complaint of chronic L thigh pain. Pain is provoked by prolonged standing and walking, end range motions for the L hip and knee, resisted L knee ext, and palpation of the L vastus lateralis. Pain appears to be muscular in nature. Pt demonstrates decreased strength of the L hip and knee and has a decreased single leg stance time L vs R. Pt was initiated on a HEP. Pt will benefit from skilled PT 2w6 to address impairments to optimize function with less pain.   OBJECTIVE IMPAIRMENTS: decreased activity tolerance, difficulty walking, decreased strength, and pain.   ACTIVITY LIMITATIONS: carrying, lifting, bending, standing, squatting, stairs, locomotion level, and caring for others  PARTICIPATION LIMITATIONS: meal prep, cleaning, laundry, and occupation  PERSONAL FACTORS: Age, Fitness, Past/current experiences, and Time since onset of injury/illness/exacerbation are also affecting patient's  functional outcome.   REHAB POTENTIAL: Good  CLINICAL DECISION MAKING: Stable/uncomplicated  EVALUATION COMPLEXITY: Low   GOALS:  SHORT TERM GOALS: Target date: 03/18/23 Pt will be Ind in an initial HEP  Baseline: started 03/15/23: Pt reports intermittent compliance  Goal status: ONGOING  2.  Pt will voice understanding of measures to assist in pain reduction  Baseline:  03/15/23: has floor pedals, heat, massager  Goal status: MET  LONG TERM GOALS: Target date: 04/15/23  Pt will be Ind in a final HEP to maintain achieved LOF  Baseline: started Goal status: INITIAL  2.  Increase L hips and knee strength by  MMT grade for improved function  Baseline: see flow  sheets Goal status: ONGOING  3.  Increase L single leg stance to 12" or greater as indication of improved strength and balance Baseline: L 2", R 13" 03/22/23: L 8" Goal status: ONGOING   4.  Pt will report a 50% or greater improvement with L thigh pain with daily activities for improved function and QOL Baseline: 0-10/10 Goal status: INITIAL  5.  Pt's FOTO score will improved to the predicted value as indication of improved function  Baseline: TBA 03/09/23: 51%  predicted to 64% Goal status: INITIAL  6.  Assess balance per Sharlene Motts and set goal as indicated (MCID 5 pts) Baseline: TBA 03/09/23:  56/56 Goal status: DEFERRED    PLAN:  PT FREQUENCY: 2x/week  PT DURATION: 6 weeks  PLANNED INTERVENTIONS: Therapeutic exercises, Therapeutic activity, Neuromuscular re-education, Balance training, Gait training, Patient/Family education, Self Care, Joint mobilization, Stair training, Aquatic Therapy, Dry Needling, Cryotherapy, Moist heat, Taping, Ultrasound, Ionotophoresis 4mg /ml Dexamethasone, Manual therapy, and Re-evaluation  PLAN FOR NEXT SESSION: ; NEED FOTO status: assess response to HEP; progress therex as indicated; use of modalities, manual therapy; and TPDN as indicated.  Glendora Clouatre MS, PT 04/08/23 11:45 AM

## 2023-04-08 ENCOUNTER — Ambulatory Visit: Payer: 59

## 2023-04-08 DIAGNOSIS — M79652 Pain in left thigh: Secondary | ICD-10-CM | POA: Diagnosis not present

## 2023-04-08 DIAGNOSIS — R262 Difficulty in walking, not elsewhere classified: Secondary | ICD-10-CM

## 2023-04-08 DIAGNOSIS — M6281 Muscle weakness (generalized): Secondary | ICD-10-CM

## 2023-04-08 NOTE — Patient Instructions (Signed)

## 2023-04-11 ENCOUNTER — Encounter: Payer: Self-pay | Admitting: Physical Therapy

## 2023-04-11 ENCOUNTER — Ambulatory Visit: Payer: 59 | Admitting: Physical Therapy

## 2023-04-11 DIAGNOSIS — M79652 Pain in left thigh: Secondary | ICD-10-CM

## 2023-04-11 DIAGNOSIS — R262 Difficulty in walking, not elsewhere classified: Secondary | ICD-10-CM

## 2023-04-11 DIAGNOSIS — M6281 Muscle weakness (generalized): Secondary | ICD-10-CM

## 2023-04-11 NOTE — Therapy (Signed)
OUTPATIENT PHYSICAL THERAPY LOWER TREATMENT NOTE   Patient Name: Jasmine Vaughan MRN: [id number] DOB:April 19, 1941, 82 y.o., female Today's Date: 04/11/2023  END OF SESSION:  PT End of Session - 04/11/23 1147     Visit Number 10    Number of Visits 13    Date for PT Re-Evaluation 04/15/23    Authorization Type [employer] DUAL COMPLETE; MEDICAID OF [place]    Authorization - Visit Number --    Authorization - Number of Visits --    Progress Note Due on Visit 10    PT Start Time 1145    PT Stop Time 1230    PT Time Calculation (min) 45 min                 Past Medical History:  Diagnosis Date   Arthritis    Hypertension    Past Surgical History:  Procedure Laterality Date   BREAST SURGERY     Patient Active Problem List   Diagnosis Date Noted   Personal history of breast cancer 01/05/2023   Uncontrolled hypertension 12/29/2022   Mixed hyperlipidemia 12/29/2022   Osteoarthritis of left hip 12/29/2022   Urinary urgency 12/29/2022   PAC (premature atrial contraction) 12/29/2022    PCP: Marrianne Mood, MD  REFERRING PROVIDER: Gust Rung, DO  REFERRING DIAG: M19.90 (ICD-10-CM) - Arthritis   THERAPY DIAG:  Pain in left thigh  Difficulty in walking, not elsewhere classified  Muscle weakness (generalized)  Rationale for Evaluation and Treatment: Rehabilitation  ONSET DATE: 2-3 years  SUBJECTIVE:   SUBJECTIVE STATEMENT: Maybe a little better the day of dry needling but the pain was back and the same on the next day. I want to finish my visits and f/u with the doctor.    EVAL: Chronic L thigh pain with insidious onset.  Does chair exercises about 1-2x per week.  PERTINENT HISTORY: Arthritis, HTN  PAIN:  Are you having pain? Yes: NPRS scale:5/10 Pain location: L thigh Pain description: Ache, sharp Aggravating factors: Standing and walking: 20-30 mins Relieving factors: Sitting down Pain range on eval: 1-10/10  PRECAUTIONS: None  RED  FLAGS: None   WEIGHT BEARING RESTRICTIONS: No  FALLS:  Has patient fallen in last 6 months? 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, privately  PLOF: Independent  PATIENT GOALS: Pain relief  NEXT MD VISIT: Pt was not able to recall f/u appt  OBJECTIVE:   DIAGNOSTIC FINDINGS: Order for hip xray in Epic  PATIENT SURVEYS:  FOTO To be completed, wrong folder provided to pt 03/08/23: 51% intake to  64% prediction  COGNITION: Overall cognitive status: Within functional limits for tasks assessed     SENSATION: WFL  EDEMA:  None palpated or observed  MUSCLE LENGTH: Hamstrings: Right WNLs ; Left WNLs  Thomas test: Right tight ; Left tight   POSTURE: rounded shoulders, forward head, and anterior pelvic tilt  PALPATION: TTP of the L vastus lateralis  LOWER EXTREMITY ROM:  WNLS bilat Active ROM Right eval Left eval Right 03/15/23   Hip flexion     Hip extension     Hip abduction     Hip adduction     Hip internal rotation   thigh pain at end range   Hip external rotation  thigh pain at end range   Knee flexion  thigh  pain at end range 140  Knee extension     Ankle dorsiflexion     Ankle plantarflexion     Ankle inversion     Ankle eversion      (Blank rows = not tested)  LOWER EXTREMITY MMT:  MMT Right eval Left eval Left  03/22/23  Hip flexion 4 3+ 4  Hip extension 3+ 3 3+  Hip abduction 4 3+ 4  Hip adduction     Hip internal rotation     Hip external rotation 4 3+ 4  Knee flexion 4+ 4+   Knee extension 4+ 4 pain   Ankle dorsiflexion     Ankle plantarflexion     Ankle inversion     Ankle eversion      (Blank rows = not tested)  LOWER EXTREMITY SPECIAL TESTS:  Hip special tests: Luisa Hart (FABER) test: negative, SI compression test: negative, and Hip scouring test:  negative  FUNCTIONAL TESTS:  SLS  L = 6", R = 13" 03/09/23:FOTO: taken on 3rd visit: 51% predicted to 64%  GAIT: Distance walked: 200' Assistive device utilized: None Level of assistance: Complete Independence Comments: gait pattern WNLs   TODAY'S TREATMENT:  [facility] Adult PT Treatment:                                                DATE: 04/11/23 Therapeutic Exercise: Nustep L4 UE/LE x 5 minutes  Static lunge at counter x 10 each- bilat UE support  STS x 10 5# OMEGA bil knee ext x 10 10# OMEGA bil knee ext x 10 5# RLE eccentric OMEGA x 6 SL Left leg press 15#  L SLR with ER 10 x 2  Hip flexor stretch EOM left  L hip abduction 10 x 2  Piriformis stretch Hamstring stretch Supine ITB stretch with strap L x 2      [facility] Adult PT Treatment:                                                DATE: 04/08/23 Therapeutic Exercise: Nustep 5 min L4 UE/LE L SLR with ER x10 Bridging x10 H/L clam GTB 2x10 LAQ 2x10 4# STS x10 STS 10# Manual Therapy: STM to the L quad Skilled palpation to the L quad to identify TrPs and taut muscle bands Trigger Point Dry Needling Treatment: Pre-treatment instruction: Patient instructed on dry needling rationale, procedures, and possible side effects including pain during treatment (achy,cramping feeling), bruising, drop of blood, lightheadedness, nausea, sweating. Patient Consent Given: No Education handout provided: Yes Muscles treated: L rectus femoris and vastus lateralis  Needle size and number: .30x26mm x 1 Electrical stimulation performed: No Parameters: N/A Treatment response/outcome: Twitch response elicited Post-treatment instructions: Patient instructed to expect possible mild to moderate muscle soreness later today and/or tomorrow. Patient instructed in methods to reduce muscle soreness and to continue prescribed HEP. If patient was dry needled over the lung field, patient was instructed on signs and symptoms of pneumothorax and, however  unlikely, to see immediate medical attention should they occur. Patient was also educated on signs and symptoms of infection and to seek medical attention should they occur. Patient verbalized understanding of these instructions and education.     [facility] Adult PT  Treatment:                                                DATE: 04/06/23 Therapeutic Exercise: Nustep 5 min L4 UE/LE L SLR with ER x10 Bridging x10 H/L clam GTB 2x10 LAQ 2x10 c ball squeeze 4# STS x10 Manual Therapy: STM to the L quad Self Care: Provided info sheet on TPDN  [facility] Adult PT Treatment:                                                DATE: 03/24/23 Therapeutic Exercise: STS STS 10# 8 inch step up with march Lunge at counter 5 x 2 each - mod cues for form L SLR with ER  Figure 4 bridge Prone knee flex/ hip ext Qped on forearms donkey kicks alternating  EOM hamstring stretch  Self Care TPDN further discussed and information sheets given for review                                                                         PATIENT EDUCATION:  Education details: Eval findings, POC, HEP  Person educated: Patient Education method: Explanation, Demonstration, Tactile cues, Verbal cues, and Handouts Education comprehension: verbalized understanding, returned demonstration, verbal cues required, tactile cues required, and needs further education  HOME EXERCISE PROGRAM: Access Code: [email or web address] URL: [email or web address] Date: 03/08/2023 Prepared by: Joellyn Rued  Exercises - Supine Quad Set  - 1 x daily - 7 x weekly - 2 sets - 10 reps - 5 hold - Active Straight Leg Raise with Quad Set  - 1 x daily - 7 x weekly - 2 sets - 10 reps - 3 hold - Supine Bridge with Resistance Band  - 1 x daily - 7 x weekly - 2 sets - 10 reps - 3 hold - Hooklying Clamshell with Resistance  - 1 x daily - 7 x weekly - 2 sets - 10 reps - 3 hold  - Modified Thomas Stretch  - 1 x daily - 7 x weekly - 1 sets - 30 reps - 30 hold -  Straight Leg Raise with External Rotation  - 1 x daily - 7 x weekly - 1-2 sets - 10 reps  - Figure 4 Bridge  - 1 x daily - 7 x weekly - 1-2 sets - 10 reps - Sit-stand with reach  - 1 x daily - 7 x weekly - 1-2 sets - 10 reps  ASSESSMENT:  CLINICAL IMPRESSION: Progressed quad and LE strength to gym machines. She tolerates the progression well with reports of LLE feeling more weak compared to RLE. She reports no change after TPDN. Added ITB which she reported feeling a good stretch in the area of her pain. She has one more visit in POC and plans to discharge. Needs FOTO and goal check.Marland Kitchen    EVAL: Patient is a 82 y.o. female who was seen today for physical therapy evaluation and treatment for M19.90 (ICD-10-CM) - Arthritis.  Pt presents to PT with complaint of chronic L thigh pain. Pain is provoked by prolonged standing and walking, end range motions for the L hip and knee, resisted L knee ext, and palpation of the L vastus lateralis. Pain appears to be muscular in nature. Pt demonstrates decreased strength of the L hip and knee and has a decreased single leg stance time L vs R. Pt was initiated on a HEP. Pt will benefit from skilled PT 2w6 to address impairments to optimize function with less pain.   OBJECTIVE IMPAIRMENTS: decreased activity tolerance, difficulty walking, decreased strength, and pain.   ACTIVITY LIMITATIONS: carrying, lifting, bending, standing, squatting, stairs, locomotion level, and caring for others  PARTICIPATION LIMITATIONS: meal prep, cleaning, laundry, and occupation  PERSONAL FACTORS: Age, Fitness, Past/current experiences, and Time since onset of injury/illness/exacerbation are also affecting patient's functional outcome.   REHAB POTENTIAL: Good  CLINICAL DECISION MAKING: Stable/uncomplicated  EVALUATION COMPLEXITY: Low   GOALS:  SHORT TERM GOALS: Target date: 03/18/23 Pt will be Ind in an initial HEP  Baseline: started 03/15/23: Pt reports intermittent  compliance  Goal status: ONGOING  2.  Pt will voice understanding of measures to assist in pain reduction  Baseline:  03/15/23: has floor pedals, heat, massager  Goal status: MET  LONG TERM GOALS: Target date: 04/15/23  Pt will be Ind in a final HEP to maintain achieved LOF  Baseline: started Goal status: INITIAL  2.  Increase L hips and knee strength by  MMT grade for improved function  Baseline: see flow sheets Goal status: ONGOING  3.  Increase L single leg stance to 12" or greater as indication of improved strength and balance Baseline: L 2", R 13" 03/22/23: L 8" Goal status: ONGOING   4.  Pt will report a 50% or greater improvement with L thigh pain with daily activities for improved function and QOL Baseline: 0-10/10 Goal status: INITIAL  5.  Pt's FOTO score will improved to the predicted value as indication of improved function  Baseline: TBA 03/09/23: 51%  predicted to 64% Goal status: INITIAL  6.  Assess balance per Sharlene Motts and set goal as indicated (MCID 5 pts) Baseline: TBA 03/09/23:  56/56 Goal status: DEFERRED    PLAN:  PT FREQUENCY: 2x/week  PT DURATION: 6 weeks  PLANNED INTERVENTIONS: Therapeutic exercises, Therapeutic activity, Neuromuscular re-education, Balance training, Gait training, Patient/Family education, Self Care, Joint mobilization, Stair training, Aquatic Therapy, Dry Needling, Cryotherapy, Moist heat, Taping, Ultrasound, Ionotophoresis 4mg /ml Dexamethasone, Manual therapy, and Re-evaluation  PLAN FOR NEXT SESSION: ; NEED FOTO status: assess response to HEP; progress therex as indicated; use of modalities, manual therapy; and TPDN as indicated.  Jannette Spanner, PTA 04/11/23 12:40 PM Phone: [phone] Fax: [phone]

## 2023-04-12 NOTE — Therapy (Addendum)
OUTPATIENT PHYSICAL THERAPY LOWER TREATMENT NOTE/Progress Note/Discharge   Patient Name: Jasmine Vaughan MRN: [id number] DOB:[birth year], 82 y.o., female Today's Date: 04/13/2023  Progress Note Reporting Period 02/22/23 to 04/13/23  See note below for Objective Data and Assessment of Progress/Goals.      END OF SESSION:  PT End of Session - 04/13/23 1141     Visit Number 11    Number of Visits 13    Date for PT Re-Evaluation 04/15/23    Authorization Type [employer] DUAL COMPLETE; MEDICAID OF [place]    Authorization - Visit Number 10    Authorization - Number of Visits 27    PT Start Time 1100    PT Stop Time 1140    PT Time Calculation (min) 40 min    Activity Tolerance Patient tolerated treatment well    Behavior During Therapy WFL for tasks assessed/performed                  Past Medical History:  Diagnosis Date   Arthritis    Hypertension    Past Surgical History:  Procedure Laterality Date   BREAST SURGERY     Patient Active Problem List   Diagnosis Date Noted   Personal history of breast cancer 01/05/2023   Uncontrolled hypertension 12/29/2022   Mixed hyperlipidemia 12/29/2022   Osteoarthritis of left hip 12/29/2022   Urinary urgency 12/29/2022   PAC (premature atrial contraction) 12/29/2022    PCP: Marrianne Mood, MD  REFERRING PROVIDER: Gust Rung, DO  REFERRING DIAG: M19.90 (ICD-10-CM) - Arthritis   THERAPY DIAG:  Pain in left thigh  Difficulty in walking, not elsewhere classified  Muscle weakness (generalized)  Rationale for Evaluation and Treatment: Rehabilitation  ONSET DATE: 2-3 years  SUBJECTIVE:   SUBJECTIVE STATEMENT: Pt continues to report deep L thigh pain which overall has not improved.   EVAL: Chronic L thigh pain with insidious onset.  Does chair exercises about 1-2x per week.  PERTINENT HISTORY: Arthritis, HTN  PAIN:  Are you having pain? Yes: NPRS scale:8/10 Pain location: L thigh Pain  description: Ache, sharp Aggravating factors: Standing and walking: 20-30 mins Relieving factors: Sitting down Pain range on eval: 1-10/10  PRECAUTIONS: None  RED FLAGS: None   WEIGHT BEARING RESTRICTIONS: No  FALLS:  Has patient fallen in last 6 months? 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, privately  PLOF: Independent  PATIENT GOALS: Pain relief  NEXT MD VISIT: Pt was not able to recall f/u appt  OBJECTIVE:   DIAGNOSTIC FINDINGS: Order for hip xray in Epic  PATIENT SURVEYS:  FOTO To be completed, wrong folder provided to pt 03/08/23: 51% intake to  64% prediction  COGNITION: Overall cognitive status: Within functional limits for tasks assessed     SENSATION: WFL  EDEMA:  None palpated or observed  MUSCLE LENGTH: Hamstrings: Right WNLs ; Left WNLs  Thomas test: Right tight ; Left tight   POSTURE: rounded shoulders, forward head, and anterior pelvic tilt  PALPATION: TTP of the L vastus lateralis  LOWER EXTREMITY ROM:  WNLS bilat Active ROM Right eval Left eval Right 03/15/23   Hip flexion     Hip extension     Hip abduction     Hip adduction     Hip internal rotation   thigh pain at end range  Hip external rotation  thigh pain at end range   Knee flexion  thigh pain at end range 140  Knee extension     Ankle dorsiflexion     Ankle plantarflexion     Ankle inversion     Ankle eversion      (Blank rows = not tested)  LOWER EXTREMITY MMT:  MMT Right eval Left eval Left  03/22/23  Hip flexion 4 3+ 4  Hip extension 3+ 3 3+  Hip abduction 4 3+ 4  Hip adduction     Hip internal rotation     Hip external rotation 4 3+ 4  Knee flexion 4+ 4+   Knee extension 4+ 4 pain   Ankle dorsiflexion     Ankle plantarflexion     Ankle inversion     Ankle eversion       (Blank rows = not tested)  LOWER EXTREMITY SPECIAL TESTS:  Hip special tests: Luisa Hart (FABER) test: negative, SI compression test: negative, and Hip scouring test: negative  FUNCTIONAL TESTS:  SLS  L = 6", R = 13" 03/09/23:FOTO: taken on 3rd visit: 51% predicted to 64%  GAIT: Distance walked: 200' Assistive device utilized: None Level of assistance: Complete Independence Comments: gait pattern WNLs   TODAY'S TREATMENT: [facility] Adult PT Treatment:                                                DATE: 04/13/23 Therapeutic Exercise: STS x10 LAQ x10 SLR x10 SLR c ER x10 Bridging x10 Clam x15 c GTB Hip add ball squeeze x15 SLS L 10" Therapeutic Activity: FOTO reassessed and reviewed  [facility] Adult PT Treatment:                                                DATE: 04/11/23 Therapeutic Exercise: Nustep L4 UE/LE x 5 minutes  Static lunge at counter x 10 each- bilat UE support  STS x 10 5# OMEGA bil knee ext x 10 10# OMEGA bil knee ext x 10 5# RLE eccentric OMEGA x 6 SL Left leg press 15#  L SLR with ER 10 x 2  Hip flexor stretch EOM left  L hip abduction 10 x 2  Piriformis stretch Hamstring stretch Supine ITB stretch with strap L x 2   [facility] Adult PT Treatment:                                                DATE: 04/08/23 Therapeutic Exercise: Nustep 5 min L4 UE/LE L SLR with ER x10 Bridging x10 H/L clam GTB 2x10 LAQ 2x10 4# STS x10 STS 10# Manual Therapy: STM to the L quad Skilled palpation to the L quad to identify TrPs and taut muscle bands Trigger Point Dry Needling Treatment: Pre-treatment instruction: Patient instructed on dry needling rationale, procedures, and possible side effects including pain during treatment (achy,cramping feeling), bruising, drop of blood, lightheadedness, nausea, sweating. Patient Consent Given: No Education handout provided: Yes Muscles treated: L rectus femoris and vastus lateralis  Needle size and number: .30x47mm x 1 Electrical  stimulation performed: No Parameters:  N/A Treatment response/outcome: Twitch response elicited Post-treatment instructions: Patient instructed to expect possible mild to moderate muscle soreness later today and/or tomorrow. Patient instructed in methods to reduce muscle soreness and to continue prescribed HEP. If patient was dry needled over the lung field, patient was instructed on signs and symptoms of pneumothorax and, however unlikely, to see immediate medical attention should they occur. Patient was also educated on signs and symptoms of infection and to seek medical attention should they occur. Patient verbalized understanding of these instructions and education.     [facility] Adult PT Treatment:                                                DATE: 04/06/23 Therapeutic Exercise: Nustep 5 min L4 UE/LE L SLR with ER x10 Bridging x10 H/L clam GTB 2x10 LAQ 2x10 c ball squeeze 4# STS x10 Manual Therapy: STM to the L quad Self Care: Provided info sheet on TPDN  [facility] Adult PT Treatment:                                                DATE: 03/24/23 Therapeutic Exercise: STS STS 10# 8 inch step up with march Lunge at counter 5 x 2 each - mod cues for form L SLR with ER  Figure 4 bridge Prone knee flex/ hip ext Qped on forearms donkey kicks alternating  EOM hamstring stretch  Self Care TPDN further discussed and information sheets given for review                                                                         PATIENT EDUCATION:  Education details: Eval findings, POC, HEP  Person educated: Patient Education method: Explanation, Demonstration, Tactile cues, Verbal cues, and Handouts Education comprehension: verbalized understanding, returned demonstration, verbal cues required, tactile cues required, and needs further education  HOME EXERCISE PROGRAM: Access Code: [email or web address] URL: [email or web address] Date: 04/13/2023 Prepared by: Joellyn Rued  Exercises - Supine  Quad Set  - 1 x daily - 7 x weekly - 2 sets - 10 reps - 5 hold - Active Straight Leg Raise with Quad Set  - 1 x daily - 7 x weekly - 2 sets - 10 reps - 3 hold - Supine Bridge with Resistance Band  - 1 x daily - 7 x weekly - 2 sets - 10 reps - 3 hold - Hooklying Clamshell with Resistance  - 1 x daily - 7 x weekly - 2 sets - 10 reps - 3 hold - Modified Thomas Stretch  - 1 x daily - 7 x weekly - 1 sets - 30 reps - 30 hold - Straight Leg Raise with External Rotation  - 1 x daily - 7 x weekly - 1-2 sets - 10 reps - Figure 4 Bridge  - 1 x daily - 7 x weekly - 1-2 sets - 10 reps - Sit-stand with reach  - 1 x daily - 7 x weekly -  1-2 sets - 10 reps - Seated Long Arc Quad  - 1 x daily - 7 x weekly - 1-2 sets - 10 reps - 3 hold  ASSESSMENT:  CLINICAL IMPRESSION: Pt has completed her course of PT without a change in her L thigh pain. Pt's L quad/LE strength has improved, as well as her L single leg stance, but her pain has remained the same. Pt reports consistency with her HEP and exercises at her community center on a regular basis. Pt is in agreement with DC from PT services at this time.  EVAL: Patient is a 82 y.o. female who was seen today for physical therapy evaluation and treatment for M19.90 (ICD-10-CM) - Arthritis. Pt presents to PT with complaint of chronic L thigh pain. Pain is provoked by prolonged standing and walking, end range motions for the L hip and knee, resisted L knee ext, and palpation of the L vastus lateralis. Pain appears to be muscular in nature. Pt demonstrates decreased strength of the L hip and knee and has a decreased single leg stance time L vs R. Pt was initiated on a HEP. Pt will benefit from skilled PT 2w6 to address impairments to optimize function with less pain.   OBJECTIVE IMPAIRMENTS: decreased activity tolerance, difficulty walking, decreased strength, and pain.   ACTIVITY LIMITATIONS: carrying, lifting, bending, standing, squatting, stairs, locomotion level, and  caring for others  PARTICIPATION LIMITATIONS: meal prep, cleaning, laundry, and occupation  PERSONAL FACTORS: Age, Fitness, Past/current experiences, and Time since onset of injury/illness/exacerbation are also affecting patient's functional outcome.   REHAB POTENTIAL: Good  CLINICAL DECISION MAKING: Stable/uncomplicated  EVALUATION COMPLEXITY: Low   GOALS:  SHORT TERM GOALS: Target date: 03/18/23 Pt will be Ind in an initial HEP  Baseline: started 03/15/23: Pt reports intermittent compliance  Goal status: ONGOING  2.  Pt will voice understanding of measures to assist in pain reduction  Baseline:  03/15/23: has floor pedals, heat, massager  Goal status: MET  LONG TERM GOALS: Target date: 04/15/23  Pt will be Ind in a final HEP to maintain achieved LOF  Baseline: started Goal status: MET  2.  Increase L hips and knee strength by  MMT grade for improved function  Baseline: see flow sheets 04/13/23: see flow sheets Goal status: IMPROVED, but not MET  3.  Increase L single leg stance to 12" or greater as indication of improved strength and balance Baseline: L 2", R 13" 03/22/23: L 8" 04/13/23: L 10" Goal status: ONGOING   4.  Pt will report a 50% or greater improvement with L thigh pain with daily activities for improved function and QOL Baseline: 0-10/10 04/13/23: 0-10/10 Goal status: INITIAL  5.  Pt's FOTO score will improved to the predicted value as indication of improved function  Baseline: TBA 03/09/23: 51%  predicted to 64% 04/13/23: 43% Goal status: Not Met  6.  Assess balance per Sharlene Motts and set goal as indicated (MCID 5 pts) Baseline: TBA 03/09/23:  56/56 Goal status: DEFERRED    PLAN:  PT FREQUENCY: 2x/week  PT DURATION: 6 weeks  PLANNED INTERVENTIONS: Therapeutic exercises, Therapeutic activity, Neuromuscular re-education, Balance training, Gait training, Patient/Family education, Self Care, Joint mobilization, Stair training, Aquatic Therapy, Dry  Needling, Cryotherapy, Moist heat, Taping, Ultrasound, Ionotophoresis 4mg /ml Dexamethasone, Manual therapy, and Re-evaluation  PLAN FOR NEXT SESSION: ; NEED FOTO status: assess response to HEP; progress therex as indicated; use of modalities, manual therapy; and TPDN as indicated.  PHYSICAL THERAPY DISCHARGE SUMMARY  Visits from [facility]  of Care: 11  Current functional level related to goals / functional outcomes: See clinical impression and PT goals    Remaining deficits: See clinical impression and PT goals    Education / Equipment: HEP, pt Ed   Patient agrees to discharge. Patient goals were partially met. Patient is being discharged due to maximized rehab potential.    Joellyn Rued MS, PT 04/13/23 1:22 PM   Joellyn Rued MS, PT 05/05/23 8:30 AM

## 2023-04-13 ENCOUNTER — Ambulatory Visit: Payer: 59

## 2023-04-13 DIAGNOSIS — R262 Difficulty in walking, not elsewhere classified: Secondary | ICD-10-CM | POA: Diagnosis not present

## 2023-04-13 DIAGNOSIS — M6281 Muscle weakness (generalized): Secondary | ICD-10-CM

## 2023-04-13 DIAGNOSIS — M79652 Pain in left thigh: Secondary | ICD-10-CM

## 2023-04-13 NOTE — Progress Notes (Signed)
This encounter was created in error - please disregard.  I called patient twice and got her VM, I left a detailed that I would call back in several minutes.  I called back 2nd time with no answer so I left a detailed message for patient to call office and reschedule her visit.

## 2023-04-29 ENCOUNTER — Ambulatory Visit (HOSPITAL_COMMUNITY)
Admission: RE | Admit: 2023-04-29 | Discharge: 2023-04-29 | Disposition: A | Discharge status: Home / Self Care | Payer: 59 | Source: Ambulatory Visit | Attending: Internal Medicine | Admitting: Internal Medicine

## 2023-04-29 ENCOUNTER — Ambulatory Visit (INDEPENDENT_AMBULATORY_CARE_PROVIDER_SITE_OTHER): Payer: 59 | Admitting: Student

## 2023-04-29 VITALS — BP 106/58 | HR 89 | Temp 98.1°F | Ht 65.0 in | Wt 143.2 lb

## 2023-04-29 DIAGNOSIS — M1990 Unspecified osteoarthritis, unspecified site: Secondary | ICD-10-CM | POA: Insufficient documentation

## 2023-04-29 DIAGNOSIS — M25552 Pain in left hip: Secondary | ICD-10-CM | POA: Diagnosis not present

## 2023-04-29 DIAGNOSIS — M1612 Unilateral primary osteoarthritis, left hip: Secondary | ICD-10-CM

## 2023-04-29 DIAGNOSIS — R3915 Urgency of urination: Secondary | ICD-10-CM

## 2023-04-29 DIAGNOSIS — M25551 Pain in right hip: Secondary | ICD-10-CM | POA: Diagnosis not present

## 2023-04-29 DIAGNOSIS — I10 Essential (primary) hypertension: Secondary | ICD-10-CM | POA: Diagnosis not present

## 2023-04-29 DIAGNOSIS — M160 Bilateral primary osteoarthritis of hip: Secondary | ICD-10-CM | POA: Diagnosis not present

## 2023-04-29 DIAGNOSIS — M810 Age-related osteoporosis without current pathological fracture: Secondary | ICD-10-CM | POA: Diagnosis not present

## 2023-04-29 MED ORDER — HYDROCHLOROTHIAZIDE 12.5 MG PO CAPS: 12.5000 mg | ORAL_CAPSULE | Freq: Every day | ORAL | 3 refills | Status: DC

## 2023-04-29 MED ORDER — OXYBUTYNIN CHLORIDE ER 10 MG PO TB24: 10.0000 mg | ORAL_TABLET | Freq: Every day | ORAL | 3 refills | Status: AC

## 2023-04-29 MED ORDER — NAPROXEN 500 MG PO TABS: 500.0000 mg | ORAL_TABLET | Freq: Two times a day (BID) | ORAL | 0 refills | Status: DC

## 2023-04-29 NOTE — Assessment & Plan Note (Signed)
BP 106/58 in clinic today. Her regimen was supposed to be increased hydrochlorothiazide 25mg , however pt has only been taking 12.5mg  instead. Given her BP today, will continue 12.5mg  and follow up at next visit.

## 2023-04-29 NOTE — Progress Notes (Signed)
CC: Left hip pain  HPI:  Ms.Jasmine Vaughan is a 82 y.o. female living with a history stated below and presents today for left hip pain. Please see problem based assessment and plan for additional details.  Past Medical History:  Diagnosis Date   Arthritis    Hypertension     Current Outpatient Medications on File Prior to Visit  Medication Sig Dispense Refill   acetaminophen (TYLENOL) 325 MG tablet Take 650 mg by mouth every 6 (six) hours as needed.     pravastatin (PRAVACHOL) 20 MG tablet Take 20 mg by mouth daily.     No current facility-administered medications on file prior to visit.    Family History  Family history unknown: Yes    Social History   Socioeconomic History   Marital status: Widowed    Spouse name: Not on file   Number of children: Not on file   Years of education: Not on file   Highest education level: Not on file  Occupational History   Not on file  Tobacco Use   Smoking status: Never   Smokeless tobacco: Never  Vaping Use   Vaping status: Never Used  Substance and Sexual Activity   Alcohol use: No   Drug use: No   Sexual activity: Not Currently  Other Topics Concern   Not on file  Social History Narrative   Not on file   Social Determinants of Health   Financial Resource Strain: Not on file  Food Insecurity: No Food Insecurity (12/29/2022)   Hunger Vital Sign    Worried About Running Out of Food in the Last Year: Never true    Ran Out of Food in the Last Year: Never true  Transportation Needs: No Transportation Needs (12/29/2022)   PRAPARE - Administrator, Civil Service (Medical): No    Lack of Transportation (Non-Medical): No  Physical Activity: Not on file  Stress: Not on file  Social Connections: Moderately Isolated (12/29/2022)   Social Connection and Isolation Panel [NHANES]    Frequency of Communication with Friends and Family: More than three times a week    Frequency of Social Gatherings with Friends and  Family: More than three times a week    Attends Religious Services: 1 to 4 times per year    Active Member of [employer] or Organizations: No    Attends Banker Meetings: Never    Marital Status: Widowed  Intimate Partner Violence: Not At Risk (12/29/2022)   Humiliation, Afraid, Rape, and Kick questionnaire    Fear of Current or Ex-Partner: No    Emotionally Abused: No    Physically Abused: No    Sexually Abused: No    Review of Systems: ROS negative except for what is noted on the assessment and plan.  Vitals:   04/29/23 1044  BP: (!) 106/58  Pulse: 89  Temp: 98.1 F (36.7 C)  TempSrc: Oral  SpO2: 100%  Weight: 143 lb 3.2 oz (65 kg)  Height: 5\' 5"  (1.651 m)    Physical Exam: Constitutional: well-appearing female  in no acute distress Cardiovascular: regular rate and rhythm, no m/r/g Pulmonary/Chest: normal work of breathing on room air, lungs clear to auscultation bilaterally Abdominal: soft, non-tender, non-distended MSK: normal bulk and tone, tenderness to palpation on Left lateral hip, ROM in tact, normal ambulation with no limp   Assessment & Plan:   Osteoarthritis of left hip Pt presents for follow up regarding her osteoarthritis of left hip. Bilateral hip and  pelvis x-rays were ordered, however have not been completed yet. She has went to physical therapy multiple times however has not helped her pain (confirmed by chart review as well). The pain is located in the greater trochanter region and radiates to above her knee. She denies any recent falls, and has been doing her strengthening exercises her physical therapist recommended to her. She has tried tylenol arthritis and meloxicam, however has not helped her pain.   On my exam there is tenderness in the greater trochanter region on the left hip, and she is able to ambulate without difficulty.   Overall, symptoms seem apparent with greater trochanteric pain syndrome, as well as likely OA of that left hip  joint. She has trialed NSAID therapy with meloxicam, and has trialed physical therapy, next step will be for orthopedic evaluation.   Plan:  - F/U on X-Rays of bilateral hips  - Naproxen 2 week course  - Vitamin D for bone health  - Referral to Orthopedic surgery   Uncontrolled hypertension BP 106/58 in clinic today. Her regimen was supposed to be increased hydrochlorothiazide 25mg , however pt has only been taking 12.5mg  instead. Given her BP today, will continue 12.5mg  and follow up at next visit.   Urinary urgency On oxybutynin, no complaints, will refill.    Patient discussed with Dr. Dierdre Forth Katryn Plummer, M.D. [facility], PGY-2 Pager: [phone] Date 04/29/2023 Time 2:17 PM

## 2023-04-29 NOTE — Assessment & Plan Note (Addendum)
Pt presents for follow up regarding her osteoarthritis of left hip. Bilateral hip and pelvis x-rays were ordered, however have not been completed yet. She has went to physical therapy multiple times however has not helped her pain (confirmed by chart review as well). The pain is located in the greater trochanter region and radiates to above her knee. She denies any recent falls, and has been doing her strengthening exercises her physical therapist recommended to her. She has tried tylenol arthritis and meloxicam, however has not helped her pain.   On my exam there is tenderness in the greater trochanter region on the left hip, and she is able to ambulate without difficulty.   Overall, symptoms seem apparent with greater trochanteric pain syndrome, as well as likely OA of that left hip joint. She has trialed NSAID therapy with meloxicam, and has trialed physical therapy, next step will be for orthopedic evaluation.   Plan:  - F/U on X-Rays of bilateral hips  - Naproxen 2 week course  - Vitamin D for bone health  - Referral to Orthopedic surgery

## 2023-04-29 NOTE — Patient Instructions (Signed)
Thank you so much for coming to the clinic today!   I have sent in a short course of naproxen to take with meals to help with the pain. I have also sent in a referral to the orthopedics for further evaluation. I have sent in a refill of your medications, please take the hydrochlorothiazide 12.5mg  instead of the 25mg .   If you have any questions please feel free to the call the clinic at anytime at [phone]. It was a pleasure seeing you!  Best, Dr. Thomasene Ripple

## 2023-04-29 NOTE — Assessment & Plan Note (Signed)
On oxybutynin, no complaints, will refill.

## 2023-05-01 LAB — BMP8+ANION GAP
Anion Gap: 16 mmol/L (ref 10.0–18.0)
BUN/Creatinine Ratio: 13 (ref 12–28)
BUN: 10 mg/dL (ref 8–27)
CO2: 23 mmol/L (ref 20–29)
Calcium: 9.8 mg/dL (ref 8.7–10.3)
Chloride: 99 mmol/L (ref 96–106)
Creatinine, Ser: 0.78 mg/dL (ref 0.57–1.00)
Glucose: 109 mg/dL — ABNORMAL HIGH (ref 70–99)
Potassium: 4.2 mmol/L (ref 3.5–5.2)
Sodium: 138 mmol/L (ref 134–144)
eGFR: 76 mL/min/{1.73_m2} (ref >59)

## 2023-05-01 LAB — VITAMIN D 25 HYDROXY (VIT D DEFICIENCY, FRACTURES): Vit D, 25-Hydroxy: 36 ng/mL (ref 30.0–100.0)

## 2023-05-02 NOTE — Progress Notes (Signed)
Internal Medicine Clinic Attending  Case discussed with the resident at the time of the visit.  We reviewed the resident's history and exam and pertinent patient test results.  I agree with the assessment, diagnosis, and plan of care documented in the resident's note.  

## 2023-05-18 ENCOUNTER — Ambulatory Visit: Payer: 59 | Admitting: Orthopaedic Surgery

## 2023-05-18 ENCOUNTER — Encounter: Payer: Self-pay | Admitting: Orthopaedic Surgery

## 2023-05-18 ENCOUNTER — Other Ambulatory Visit: Payer: Self-pay

## 2023-05-18 ENCOUNTER — Ambulatory Visit: Payer: 59 | Admitting: Sports Medicine

## 2023-05-18 DIAGNOSIS — M25552 Pain in left hip: Secondary | ICD-10-CM | POA: Diagnosis not present

## 2023-05-18 DIAGNOSIS — M1612 Unilateral primary osteoarthritis, left hip: Secondary | ICD-10-CM | POA: Diagnosis not present

## 2023-05-18 MED ORDER — LIDOCAINE HCL 1 % IJ SOLN
4.0000 mL | INTRAMUSCULAR | Status: AC | PRN
  Administered 2023-05-18: 4 mL

## 2023-05-18 MED ORDER — METHYLPREDNISOLONE ACETATE 40 MG/ML IJ SUSP
80.0000 mg | INTRAMUSCULAR | Status: AC | PRN
  Administered 2023-05-18: 80 mg via INTRA_ARTICULAR

## 2023-05-18 NOTE — Progress Notes (Signed)
Office Visit Note   Patient: Jasmine Vaughan           Date of Birth: 20-Nov-1940           MRN: [id number] Visit Date: 05/18/2023              Requested by: Gust Rung, DO [address]  [place],  [place] [address] PCP: Marrianne Mood, MD   Assessment & Plan: Visit Diagnoses:  1. Pain in left hip     Plan: Impression is left thigh/hip pain.  Although the patient is exhibiting some occasional left low back pain, believe her pain is likely referred from the left hip joint.  We have discussed referral to Dr. Shon Baton for ultrasound-guided cortisone injection of the left hip.  If she does not notice any improvement in symptoms following the injection, we will look further into her back.  Otherwise, follow-up as needed.  Follow-Up Instructions: Return if symptoms worsen or fail to improve.   Orders:  No orders of the defined types were placed in this encounter.  No orders of the defined types were placed in this encounter.     Procedures: No procedures performed   Clinical Data: No additional findings.   Subjective: Chief Complaint  Patient presents with   Left Hip - Pain   Right Hip - Pain   Lower Back - Pain    HPI patient is a pleasant 82 year old female who comes in today with left thigh pain.  Symptoms are primarily to the left thigh with occasional radiation into the groin, lateral hip, buttock and left low back.  Symptoms began about 3 to 4 months ago which have progressively worsened.  Symptoms are constant but worse when she is standing a long time such as when she is cooking in the kitchen.  She gets mild relief when she lifts her leg.  She has tried NSAIDs without relief.  She denies any paresthesias to the left lower extremity.  She has also been to physical therapy which has not  Review of Systems.  As detailed in HPI.  All others reviewed and are negative.   Objective: Vital Signs: There were no vitals taken for this visit.  Physical Exam  well-developed and well-nourished female in no acute distress.  Alert and oriented x 3  Ortho Exam left hip exam: She does have pain with logroll, FADIR and Stinchfield testing.  No pain with straight leg raise.  No pain with lumbar flexion, extension or rotation.  She does have mild tenderness to the left lower lumbar paraspinous musculature.    Specialty Comments:  No specialty comments available.  Imaging: No new imaging   PMFS History: Patient Active Problem List   Diagnosis Date Noted   Personal history of breast cancer 01/05/2023   Uncontrolled hypertension 12/29/2022   Mixed hyperlipidemia 12/29/2022   Osteoarthritis of left hip 12/29/2022   Urinary urgency 12/29/2022   PAC (premature atrial contraction) 12/29/2022   Past Medical History:  Diagnosis Date   Arthritis    Hypertension     Family History  Family history unknown: Yes    Past Surgical History:  Procedure Laterality Date   BREAST SURGERY     Social History   Occupational History   Not on file  Tobacco Use   Smoking status: Never   Smokeless tobacco: Never  Vaping Use   Vaping status: Never Used  Substance and Sexual Activity   Alcohol use: No   Drug use: No  Sexual activity: Not Currently

## 2023-05-18 NOTE — Progress Notes (Signed)
     Procedure Note  Patient: Jasmine Vaughan             Date of Birth: February 07, 1941           MRN: [id number]             Visit Date: 05/18/2023  Procedures: Visit Diagnoses:  1. Pain in left hip   2. Primary osteoarthritis of left hip    Large Joint Inj: L hip joint on 05/18/2023 10:45 AM Indications: pain Details: 22 G 3.5 in needle, ultrasound-guided anterior approach Medications: 4 mL lidocaine 1 %; 80 mg methylPREDNISolone acetate 40 MG/ML Outcome: tolerated well, no immediate complications  Procedure: US-guided intra-articular hip injection, left After discussion on risks/benefits/indications and informed verbal consent was obtained, a timeout was performed. Patient was lying supine on exam table. The hip was cleaned with betadine and alcohol swabs. Then utilizing ultrasound guidance, the patient's femoral head and neck junction was identified and subsequently injected with 4:2 lidocaine:depomedrol via an in-plane approach with ultrasound visualization of the injectate administered into the hip joint. Patient tolerated procedure well without immediate complications.  Procedure, treatment alternatives, risks and benefits explained, specific risks discussed. Consent was given by the patient. Immediately prior to procedure a time out was called to verify the correct patient, procedure, equipment, support staff and site/side marked as required. Patient was prepped and draped in the usual sterile fashion.    - I evaluated the patient about 5 minutes post-injection and she had improvement in pain and range of motion - follow-up with Dr. Roda Shutters as indicated; I am happy to see them as needed  Madelyn Brunner, DO Primary Care Sports Medicine Physician  [facility]  This note was dictated using Dragon naturally speaking software and may contain errors in syntax, spelling, or content which have not been identified prior to signing this note.

## 2023-05-27 ENCOUNTER — Encounter: Payer: Self-pay | Admitting: Student

## 2023-05-27 ENCOUNTER — Ambulatory Visit (INDEPENDENT_AMBULATORY_CARE_PROVIDER_SITE_OTHER): Payer: 59 | Admitting: Student

## 2023-05-27 VITALS — BP 129/73 | HR 85 | Temp 98.2°F | Ht 65.0 in | Wt 139.6 lb

## 2023-05-27 DIAGNOSIS — M1612 Unilateral primary osteoarthritis, left hip: Secondary | ICD-10-CM

## 2023-05-27 DIAGNOSIS — B360 Pityriasis versicolor: Secondary | ICD-10-CM | POA: Diagnosis not present

## 2023-05-27 DIAGNOSIS — I10 Essential (primary) hypertension: Secondary | ICD-10-CM

## 2023-05-27 DIAGNOSIS — Z853 Personal history of malignant neoplasm of breast: Secondary | ICD-10-CM | POA: Diagnosis not present

## 2023-05-27 DIAGNOSIS — Z13820 Encounter for screening for osteoporosis: Secondary | ICD-10-CM

## 2023-05-27 MED ORDER — FLUCONAZOLE 150 MG PO TABS
ORAL_TABLET | ORAL | 0 refills | Status: DC
Start: 2023-05-27 — End: 2023-07-25

## 2023-05-27 NOTE — Progress Notes (Signed)
   CC: Hip pain and rash  HPI:  Jasmine Vaughan is a 82 y.o. female living with a history stated below and presents today for checkup. Please see problem based assessment and plan for additional details.  Past Medical History:  Diagnosis Date   Arthritis    Hypertension     Review of Systems: ROS negative except for what is noted on the assessment and plan.  Vitals:   05/27/23 1027  BP: 129/73  Pulse: 85  Temp: 98.2 F (36.8 C)  TempSrc: Oral  SpO2: 100%  Weight: 139 lb 9.6 oz (63.3 kg)  Height: 5\' 5"  (1.651 m)    Physical Exam: Constitutional: well-appearing woman sitting in chair, in no acute distress HENT: normocephalic atraumatic, mucous membranes moist Eyes: conjunctiva non-erythematous Cardiovascular: regular rate and rhythm, no m/r/g Pulmonary/Chest: normal work of breathing on room air, lungs clear to auscultation bilaterally Abdominal: soft, non-tender, non-distended MSK: normal bulk and tone Neurological: alert & oriented x 3, no focal deficit Skin: warm and dry. Maculopapular splotchy rash that is not raised and without abrasion/drainage but is scaly is present on the bilateral flanks, upper lateral back, lateral gluteus and thigh, but spares the central back, hands, central abdomen, legs, arms, face. Psych: normal mood and behavior   Assessment & Plan:     Patient seen with Dr. Mayford Knife  Tinea versicolor Complaint of rash, see picture.  It started about 3 weeks ago.  Interestingly she did start a 2-week course of naproxen 4 weeks ago.  The rash is itchy, macular, scaly, present on the bilateral flanks down through the gluteus and upper lateral thighs.  The central back, hands feet face, and central abdomen is spared.  Differential includes drug rash, tinea/fungal, eczema, dry skin.  Scaliness favors fungal or eczematous rash.  She is previously treated with Benadryl that relieves the itch, hydrocortisone which did not have much of an effect.  In light  of this, alongside with scales and white distribution, will treat for tinea infection. - Fluconazole 300 by mouth today and in 1 week for 2 total doses.  Osteoarthritis of left hip Chronic pain of left hip.  Recent imaging favors arthritis, she has failed conservative management including over-the-counter pain relievers and physical therapy previously established with orthopedics.  She had a steroid injection about 10 days ago that has offered some relief but not full.  Most likely she will continue receiving these injections.  She will follow-up with her orthopedist.  If needed they will consider pathologies related to the back, but they are favoring primary hip pain that radiates. - Continue management per Ortho.  Tylenol up to 4 g daily for breakthrough pain.  Uncontrolled hypertension Blood pressure is at goal, 129/73.  Continue hydrochlorothiazide 12.5 daily  Personal history of breast cancer Notes 4 lbs weight loss since last visit. Very distant history of breast cancer, 20 years ago. No fevers, chills, sweats. Monitor.  Katheran James, D.O. [facility], PGY-1 Phone: [phone] Date 05/27/2023 Time 1:13 PM

## 2023-05-27 NOTE — Assessment & Plan Note (Signed)
Blood pressure is at goal, 129/73.  Continue hydrochlorothiazide 12.5 daily

## 2023-05-27 NOTE — Assessment & Plan Note (Signed)
Chronic pain of left hip.  Recent imaging favors arthritis, she has failed conservative management including over-the-counter pain relievers and physical therapy previously established with orthopedics.  She had a steroid injection about 10 days ago that has offered some relief but not full.  Most likely she will continue receiving these injections.  She will follow-up with her orthopedist.  If needed they will consider pathologies related to the back, but they are favoring primary hip pain that radiates. - Continue management per Ortho.  Tylenol up to 4 g daily for breakthrough pain.

## 2023-05-27 NOTE — Assessment & Plan Note (Signed)
Complaint of rash, see picture.  It started about 3 weeks ago.  Interestingly she did start a 2-week course of naproxen 4 weeks ago.  The rash is itchy, macular, scaly, present on the bilateral flanks down through the gluteus and upper lateral thighs.  The central back, hands feet face, and central abdomen is spared.  Differential includes drug rash, tinea/fungal, eczema, dry skin.  Scaliness favors fungal or eczematous rash.  She is previously treated with Benadryl that relieves the itch, hydrocortisone which did not have much of an effect.  In light of this, alongside with scales and white distribution, will treat for tinea infection. - Fluconazole 300 by mouth today and in 1 week for 2 total doses.

## 2023-05-27 NOTE — Assessment & Plan Note (Signed)
Notes 4 lbs weight loss since last visit. Very distant history of breast cancer, 20 years ago. No fevers, chills, sweats. Monitor.

## 2023-05-28 LAB — CBC
Hematocrit: 41.4 % (ref 34.0–46.6)
Hemoglobin: 13.5 g/dL (ref 11.1–15.9)
MCH: 28 pg (ref 26.6–33.0)
MCHC: 32.6 g/dL (ref 31.5–35.7)
MCV: 86 fL (ref 79–97)
Platelets: 391 10*3/uL (ref 150–450)
RBC: 4.82 x10E6/uL (ref 3.77–5.28)
RDW: 14.2 % (ref 11.7–15.4)
WBC: 4.1 10*3/uL (ref 3.4–10.8)

## 2023-05-30 ENCOUNTER — Emergency Department (HOSPITAL_BASED_OUTPATIENT_CLINIC_OR_DEPARTMENT_OTHER)
Admission: EM | Admit: 2023-05-30 | Discharge: 2023-05-30 | Disposition: A | Discharge status: Home / Self Care | Payer: 59 | Attending: Emergency Medicine | Admitting: Emergency Medicine

## 2023-05-30 ENCOUNTER — Other Ambulatory Visit: Payer: Self-pay

## 2023-05-30 ENCOUNTER — Encounter (HOSPITAL_BASED_OUTPATIENT_CLINIC_OR_DEPARTMENT_OTHER): Payer: Self-pay

## 2023-05-30 DIAGNOSIS — Z853 Personal history of malignant neoplasm of breast: Secondary | ICD-10-CM | POA: Insufficient documentation

## 2023-05-30 DIAGNOSIS — K5900 Constipation, unspecified: Secondary | ICD-10-CM | POA: Diagnosis not present

## 2023-05-30 HISTORY — DX: Overactive bladder: N32.81

## 2023-05-30 MED ORDER — FLEET ENEMA RE ENEM
1.0000 | ENEMA | Freq: Once | RECTAL | Status: AC
  Administered 2023-05-30: 1 via RECTAL
  Filled 2023-05-30: qty 1

## 2023-05-30 NOTE — ED Notes (Signed)
Pt given discharge instructions. Opportunities given for questions. Pt verbalizes understanding. Stone,Heather R, RN 

## 2023-05-30 NOTE — ED Triage Notes (Signed)
In for eval of constipation. Last BM last week. Rectal pain. Took suppository without relief.

## 2023-05-30 NOTE — Discharge Instructions (Addendum)
Please follow-up with your primary care provider regards recent ER visit.  Please drink plenty of fluids and to have high-fiber diet including 1 cup of bran daily or Metamucil 1 to 2 teaspoons 3 times daily with plenty of exercise.  You may use Dulcolax, magnesium citrate, MiraLAX if symptoms do persist.  If symptoms change or worsen please return to the ER.

## 2023-05-30 NOTE — ED Provider Notes (Signed)
[place] EMERGENCY DEPARTMENT AT [facility] Provider Note   CSN: [id number] Arrival date & time: 05/30/23  1236     History  Chief Complaint  Patient presents with   Constipation    Jasmine Vaughan is a 82 y.o. female history of hyperlipidemia, breast cancer, tinea versicolor, PACs presented for constipation over the past week.  Patient denies fevers, narcotic use, medication changes, fevers, abdominal pain, nausea/vomiting, dysuria, hematuria.  Patient states that she has had this before and just needs a disimpaction as she feels the stool is right at her rectum.  Patient tried a suppository at home and tried to self disimpact however this did not work as this was a few days ago.  Patient does not want any labs or imaging at this time and just wants the stool taken out.  Home Medications Prior to Admission medications   Medication Sig Start Date End Date Taking? Authorizing Provider  hydrochlorothiazide (MICROZIDE) 12.5 MG capsule Take 1 capsule (12.5 mg total) by mouth daily. 04/29/23  Yes Nooruddin, Jason Fila, MD  oxybutynin (DITROPAN-XL) 10 MG 24 hr tablet Take 1 tablet (10 mg total) by mouth at bedtime. 04/29/23  Yes Nooruddin, Jason Fila, MD  pravastatin (PRAVACHOL) 20 MG tablet Take 20 mg by mouth daily. 08/19/22  Yes [provider]  acetaminophen (TYLENOL) 325 MG tablet Take 650 mg by mouth every 6 (six) hours as needed.    [provider]  fluconazole (DIFLUCAN) 150 MG tablet Take 300mg  once weekly for two weeks. 05/27/23   Katheran Jaeden Messer, DO  naproxen (NAPROSYN) 500 MG tablet Take 1 tablet (500 mg total) by mouth 2 (two) times daily with a meal. 04/29/23 04/28/24  Nooruddin, Jason Fila, MD      Allergies    Bee venom, Other, Yellow jacket venom, and Wasp venom    Review of Systems   Review of Systems  Gastrointestinal:  Positive for constipation.    Physical Exam Updated Vital Signs BP (!) 156/79 (BP Location: Right Arm)   Pulse (!) 113   Temp  97.8 F (36.6 C) (Oral)   Resp 18   Ht 5\' 5"  (1.651 m)   Wt 63.3 kg   SpO2 97%   BMI 23.23 kg/m  Physical Exam Vitals reviewed.  Constitutional:      General: She is not in acute distress. HENT:     Head: Normocephalic and atraumatic.  Eyes:     Extraocular Movements: Extraocular movements intact.     Conjunctiva/sclera: Conjunctivae normal.     Pupils: Pupils are equal, round, and reactive to light.  Cardiovascular:     Rate and Rhythm: Normal rate and regular rhythm.     Pulses: Normal pulses.     Heart sounds: Normal heart sounds.     Comments: 2+ bilateral radial/dorsalis pedis pulses with regular rate Pulmonary:     Effort: Pulmonary effort is normal. No respiratory distress.     Breath sounds: Normal breath sounds.  Abdominal:     Palpations: Abdomen is soft.     Tenderness: There is no abdominal tenderness. There is no guarding or rebound.  Genitourinary:    Comments: Chaperone: Lesleigh Noe, RN Stool in the rectal vault that I was able to disimpact Musculoskeletal:        General: Normal range of motion.     Cervical back: Normal range of motion and neck supple.     Comments: 5 out of 5 bilateral grip/leg extension strength  Skin:    General: Skin is warm and  dry.     Capillary Refill: Capillary refill takes less than 2 seconds.  Neurological:     General: No focal deficit present.     Mental Status: She is alert and oriented to person, place, and time.     Comments: Sensation intact in all 4 limbs  Psychiatric:        Mood and Affect: Mood normal.     ED Results / Procedures / Treatments   Labs (all labs ordered are listed, but only abnormal results are displayed) Labs Reviewed - No data to display  EKG None  Radiology No results found.  Procedures Procedures    Medications Ordered in ED Medications  sodium phosphate (FLEET) enema 1 enema (1 enema Rectal Given 05/30/23 1510)    ED Course/ Medical Decision Making/ A&P                                  Medical Decision Making  [facility] 82 y.o. presented today for constipation. Working DDx that I considered at this time includes, but not limited to, constipation secondary to drug use, dehydration, fecal impaction, SBO/LBO, metastasis, volvulus, anal fissure/hemorrhoids, electrolyte abnormalities, colitis.  R/o DDx: constipation secondary to drug use, dehydration, SBO/LBO, metastasis, volvulus, anal fissure/hemorrhoids, electrolyte abnormalities, colitis: These are considered less likely due to history of present illness and physical exam findings  Review of prior external notes: 05/27/2023 progress Notes  Unique Tests and My Interpretation: None  Social Determinants of Health: none  Discussion with Independent Historian:  Daughter  Discussion of Management of Tests: None  Risk: Medium: prescription drug management  Risk Stratification Score: None  Plan: On exam patient was in no acute distress with stable vitals.  Patient's exam is largely unremarkable.  I spoke to the patient and we agreed to try an enema first and if that does not work we can try a fecal disimpaction.  Patient does not use MiraLAX at home and so I do recommend after she is discharged to that she try MiraLAX drink plenty of fluids and follow-up with her primary care provider.  Patient states she does not want labs or imaging done as she feels this is just constipation and that if the stool her rectum is broken up that she can have a bowel movement.  Patient understands that this may mean weakness diagnosis and patient with full decision-making capacity verbalizes that she is okay with not doing labs or imaging at this time.  Enema ordered.  After enema patient was able to have a small bowel movement but states that she still feels stool in her rectum and wants to have disimpaction done.  Chaperone was in the room when we conducted disimpaction and stool was able to be palpated and I was able to  break up the stool there.  Patient wishes to wait until she has more stool come out.  Will reassess.  On reassessment patient was doing much better and patient and daughter are asking to be discharged at this time.  Encourage patient to follow-up with primary care provider.  I encouraged patient to drink plenty of fluids and to have high-fiber diet including 1 cup of bran daily or Metamucil 1 to 2 teaspoons 3 times daily with plenty of exercise.  I also encouraged patient to use Dulcolax, magnesium citrate, MiraLAX if symptoms do persist.  Patient was given return precautions. Patient stable for discharge at this time.  Patient verbalized  understanding of plan.  This chart was dictated using voice recognition software.  Despite best efforts to proofread,  errors can occur which can change the documentation meaning.         Final Clinical Impression(s) / ED Diagnoses Final diagnoses:  Constipation, unspecified constipation type    Rx / DC Orders ED Discharge Orders     None         Remi Deter 05/30/23 1643    Derwood Kaplan, MD 05/31/23 1340

## 2023-06-13 ENCOUNTER — Telehealth: Payer: Self-pay

## 2023-06-13 NOTE — Progress Notes (Signed)
Transition Care Management Unsuccessful Follow-up Telephone Call  Date of discharge and from where:  05/30/2023 [facility].  Attempts:  2nd Attempt  Reason for unsuccessful TCM follow-up call:  No answer/busy  Trashaun Streight [facility]  [facility], [facility] Guide Direct Dial: [phone]  Website: [email or web address]

## 2023-06-13 NOTE — Progress Notes (Signed)
Internal Medicine Clinic Attending  I was physically present during the key portions of the resident provided service and participated in the medical decision making of patient's management care. I reviewed pertinent patient test results.  The assessment, diagnosis, and plan were formulated together and I agree with the documentation in the resident's note.  Williams, Julie Anne, MD  

## 2023-06-13 NOTE — Progress Notes (Signed)
Transition Care Management Unsuccessful Follow-up Telephone Call  Date of discharge and from where:  05/30/2023 [facility]  Attempts:  1st Attempt  Reason for unsuccessful TCM follow-up call:  No answer/busy  [facility]  [facility], [facility] Guide Direct Dial: [phone]  Website: [email or web address]

## 2023-06-22 ENCOUNTER — Ambulatory Visit: Payer: 59 | Admitting: Orthopaedic Surgery

## 2023-06-24 ENCOUNTER — Ambulatory Visit: Payer: 59 | Admitting: Orthopaedic Surgery

## 2023-07-01 ENCOUNTER — Ambulatory Visit (INDEPENDENT_AMBULATORY_CARE_PROVIDER_SITE_OTHER): Payer: 59 | Admitting: Orthopaedic Surgery

## 2023-07-01 ENCOUNTER — Other Ambulatory Visit (INDEPENDENT_AMBULATORY_CARE_PROVIDER_SITE_OTHER): Payer: 59

## 2023-07-01 ENCOUNTER — Ambulatory Visit: Payer: 59 | Admitting: Orthopaedic Surgery

## 2023-07-01 ENCOUNTER — Encounter: Payer: Self-pay | Admitting: Orthopaedic Surgery

## 2023-07-01 VITALS — BP 131/87 | HR 80 | Ht 65.0 in | Wt 145.0 lb

## 2023-07-01 DIAGNOSIS — G8929 Other chronic pain: Secondary | ICD-10-CM | POA: Diagnosis not present

## 2023-07-01 DIAGNOSIS — M545 Low back pain, unspecified: Secondary | ICD-10-CM

## 2023-07-01 NOTE — Progress Notes (Unsigned)
Office Visit Note   Patient: Jasmine Vaughan           Date of Birth: Apr 13, 1941           MRN: [id number] Visit Date: 07/01/2023              Requested by: Marrianne Mood, MD [address] [place],  [place] [address] PCP: Marrianne Mood, MD   Assessment & Plan: Visit Diagnoses:  1. Chronic bilateral low back pain, unspecified whether sciatica present     Plan: Discussed with patient I think this likely is coming from lumbar curvature at the L4-5 level where she has some anterolisthesis and endplate sclerosis.  She has had conservative treatment including medications intra-articular hip injection with persistent symptoms.  Would recommend proceeding with lumbar MRI scan for review.  Follow-Up Instructions: No follow-ups on file.   Orders:  Orders Placed This Encounter  Procedures   XR Lumbar Spine 2-3 Views   MR Lumbar Spine w/o contrast   No orders of the defined types were placed in this encounter.     Procedures: No procedures performed   Clinical Data: No additional findings.   Subjective: Chief Complaint  Patient presents with   Lower Back - Pain    HPI 83 year old female with back and left leg hip and leg pain that radiates down to her knee.  She had seen Dr. Deno Etienne and Dr. Shon Baton had an injection in her hip which really did not help.  She denies numbness or tingling.  She takes calcium vitamin D multivitamins.  She does have some hypertension.  States Tylenol arthritis gave her slight relief.  No problems putting on her shoes but states if she is more active with bending and twisting she does have increased symptoms that radiate down her left leg.   Review of Systems past history of PACs ,mild hip osteoarthritis   Objective: Vital Signs: BP 131/87   Pulse 80   Ht 5\' 5"  (1.651 m)   Wt 145 lb (65.8 kg)   BMI 24.13 kg/m   Physical Exam Constitutional:      Appearance: She is well-developed.  HENT:     Head: Normocephalic.     Right Ear: External  ear normal.     Left Ear: External ear normal. There is no impacted cerumen.  Eyes:     Pupils: Pupils are equal, round, and reactive to light.  Neck:     Thyroid: No thyromegaly.     Trachea: No tracheal deviation.  Cardiovascular:     Rate and Rhythm: Normal rate.  Pulmonary:     Effort: Pulmonary effort is normal.  Abdominal:     Palpations: Abdomen is soft.  Musculoskeletal:     Cervical back: No rigidity.  Skin:    General: Skin is warm and dry.  Neurological:     Mental Status: She is alert and oriented to person, place, and time.  Psychiatric:        Behavior: Behavior normal.     Ortho Exam no pain with internal/external rotation right or left hip.  She does have pain with sciatic notch palpation on the left mild trochanteric bursal tenderness.  Negative logroll.  Positive popliteal compression test.  She is able to heel and toe walk.  Specialty Comments:  No specialty comments available.  Imaging: AP lateral lumbar images are obtained and reviewed.  This shows a right lumbar curvature.  Anterolisthesis L4-5 grade 1 with endplate sclerosis.  Mild hip spurring right and  left hip noted.  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   PMFS History: Patient Active Problem List   Diagnosis Date Noted   Low back pain 07/05/2023   Tinea versicolor 05/27/2023   Personal history of breast cancer 01/05/2023   Uncontrolled hypertension 12/29/2022   Mixed hyperlipidemia 12/29/2022   Osteoarthritis of left hip 12/29/2022   Urinary urgency 12/29/2022   PAC (premature atrial contraction) 12/29/2022   Past Medical History:  Diagnosis Date   Arthritis    Hypertension    Overactive bladder     Family History  Family history unknown: Yes    Past Surgical History:  Procedure Laterality Date   BREAST SURGERY     Social History   Occupational History   Not on file  Tobacco Use   Smoking status: Never   Smokeless tobacco: Never  Vaping Use   Vaping  status: Never Used  Substance and Sexual Activity   Alcohol use: No   Drug use: No   Sexual activity: Not Currently

## 2023-07-05 DIAGNOSIS — M545 Low back pain, unspecified: Secondary | ICD-10-CM | POA: Insufficient documentation

## 2023-07-25 ENCOUNTER — Ambulatory Visit (INDEPENDENT_AMBULATORY_CARE_PROVIDER_SITE_OTHER): Payer: 59 | Admitting: Primary Care

## 2023-07-25 ENCOUNTER — Encounter (INDEPENDENT_AMBULATORY_CARE_PROVIDER_SITE_OTHER): Payer: Self-pay | Admitting: Primary Care

## 2023-07-25 VITALS — BP 148/89 | HR 79 | Resp 16 | Ht 65.0 in | Wt 139.0 lb

## 2023-07-25 DIAGNOSIS — R3915 Urgency of urination: Secondary | ICD-10-CM

## 2023-07-25 DIAGNOSIS — I10 Essential (primary) hypertension: Secondary | ICD-10-CM | POA: Diagnosis not present

## 2023-07-25 DIAGNOSIS — Z2821 Immunization not carried out because of patient refusal: Secondary | ICD-10-CM | POA: Diagnosis not present

## 2023-07-25 DIAGNOSIS — Z7689 Persons encountering health services in other specified circumstances: Secondary | ICD-10-CM | POA: Diagnosis not present

## 2023-07-25 NOTE — Progress Notes (Signed)
 New Patient Office Visit  Subjective    Patient ID: Jasmine Vaughan female  DOB: 05/26/1941  Age: 83 y.o. MRN: [id number]   CC:  Establish  HPI  Jasmine Vaughan is a 83 y.o. frail female she has a history of hyperlipidemia, breast cancer, tinea versicolor and PACs. She presents today to establish care. She has given grand daughter permission Raoul Byes) to be present and participate in her visit. Patient has No headache, No chest pain, No abdominal pain - No Nausea, No new weakness tingling or numbness, No Cough - shortness of breath  Current Outpatient Medications on File Prior to Visit  Medication Sig Dispense Refill   acetaminophen  (TYLENOL ) 325 MG tablet Take 650 mg by mouth every 6 (six) hours as needed.     Calcium Carbonate-Vitamin D  (CALCIUM-VITAMIN D  PO) Take by mouth.     fluconazole  (DIFLUCAN ) 150 MG tablet Take 300mg  once weekly for two weeks. 4 tablet 0   hydrochlorothiazide  (MICROZIDE ) 12.5 MG capsule Take 1 capsule (12.5 mg total) by mouth daily. 90 capsule 3   Multiple Vitamin (MULTIVITAMIN) tablet Take 1 tablet by mouth daily.     naproxen  (NAPROSYN ) 500 MG tablet Take 1 tablet (500 mg total) by mouth 2 (two) times daily with a meal. (Patient not taking: Reported on 07/01/2023) 28 tablet 0   oxybutynin  (DITROPAN -XL) 10 MG 24 hr tablet Take 1 tablet (10 mg total) by mouth at bedtime. 90 tablet 3   pravastatin (PRAVACHOL) 20 MG tablet Take 20 mg by mouth daily.     No current facility-administered medications on file prior to visit.     Allergies  Allergen Reactions   Bee Venom Other (See Comments)    Pt reports causes localized swelling, burning, itching   Other Other (See Comments)    Pt reports spiders cause localized swelling, burning, itching   Yellow Jacket Venom Other (See Comments)    Pt reports causes localized swelling, burning, itching   Wasp Venom Other (See Comments)    Pt reports causes localized swelling, burning, itching    Past Medical  History:  Diagnosis Date   Arthritis    Hypertension    Overactive bladder      Past Surgical History:  Procedure Laterality Date   BREAST SURGERY       Family History  Family history unknown: Yes    Social History   Socioeconomic History   Marital status: Widowed    Spouse name: Not on file   Number of children: Not on file   Years of education: Not on file   Highest education level: Not on file  Occupational History   Not on file  Tobacco Use   Smoking status: Never   Smokeless tobacco: Never  Vaping Use   Vaping status: Never Used  Substance and Sexual Activity   Alcohol use: No   Drug use: No   Sexual activity: Not Currently  Other Topics Concern   Not on file  Social History Narrative   Not on file   Social Drivers of Health   Financial Resource Strain: Not on file  Food Insecurity: No Food Insecurity (12/29/2022)   Hunger Vital Sign    Worried About Running Out of Food in the Last Year: Never true    Ran Out of Food in the Last Year: Never true  Transportation Needs: No Transportation Needs (12/29/2022)   PRAPARE - Administrator, Civil Service (Medical): No    Lack of Transportation (Non-Medical): No  Physical Activity: Not on file  Stress: Not on file  Social Connections: Moderately Isolated (12/29/2022)   Social Connection and Isolation Panel [NHANES]    Frequency of Communication with Friends and Family: More than three times a week    Frequency of Social Gatherings with Friends and Family: More than three times a week    Attends Religious Services: 1 to 4 times per year    Active Member of [employer] or Organizations: No    Attends Banker Meetings: Never    Marital Status: Widowed  Intimate Partner Violence: Not At Risk (12/29/2022)   Humiliation, Afraid, Rape, and Kick questionnaire    Fear of Current or Ex-Partner: No    Emotionally Abused: No    Physically Abused: No    Sexually Abused: No   Health Maintenance  Topic  Date Due   DTaP/Tdap/Td vaccine (1 - Tdap) Never done   Zoster (Shingles) Vaccine (1 of 2) Never done   Pneumonia Vaccine (1 of 1 - PCV) Never done   Medicare Annual Wellness Visit  05/19/2019   Flu Shot  Never done   COVID-19 Vaccine (3 - 2024-25 season) 02/13/2023   DEXA scan (bone density measurement)  Completed   HPV Vaccine  Aged Out   Objective   BP (!) 148/89 (BP Location: Left Arm, Patient Position: Sitting, Cuff Size: Normal)   Pulse 79   Resp 16   Ht 5\' 5"  (1.651 m)   Wt 139 lb (63 kg)   SpO2 100%   BMI 23.13 kg/m   There were no vitals taken for this visit. BP Readings from Last 3 Encounters:  07/25/23 (!) 148/89  07/01/23 131/87  05/30/23 (!) 156/79   Physical Exam Constitutional:      Appearance: Normal appearance.  HENT:     Head: Normocephalic.     Right Ear: Tympanic membrane and external ear normal.     Left Ear: Tympanic membrane and external ear normal.  Eyes:     Extraocular Movements: Extraocular movements intact.  Cardiovascular:     Rate and Rhythm: Normal rate and regular rhythm.  Pulmonary:     Effort: Pulmonary effort is normal.     Breath sounds: Normal breath sounds.  Abdominal:     General: Abdomen is flat. Bowel sounds are normal.     Palpations: Abdomen is soft.  Musculoskeletal:        General: Normal range of motion.  Skin:    General: Skin is warm and dry.  Neurological:     Mental Status: She is alert and oriented to person, place, and time.    Assessment & Plan:   Yoshiko was seen today for new patient (initial visit) and sciatica.  Diagnoses and all orders for this visit:  Essential hypertension Elevated at first appt no change in medication.  BP goal - < 140/90 Explained that having normal blood pressure is the goal and medications are helping to get to goal and maintain normal blood pressure. DIET: Limit salt intake, read nutrition labels to check salt content, limit fried and high fatty foods  Avoid using multisymptom  OTC cold preparations that generally contain sudafed which can rise BP. Consult with pharmacist on best cold relief products to use for persons with HTN EXERCISE Discussed incorporating exercise such as walking - 30 minutes most days of the week and can do in 10 minute intervals     Influenza vaccination declined  Pneumococcal vaccination declined  Herpes zoster vaccination declined  Tetanus,  diphtheria, and acellular pertussis (Tdap) vaccination declined  Encounter to establish care See HPI    Urinary urgency  On oxybutynin    The above assessment and management plan was discussed with the patient. The patient verbalized understanding of and has agreed to the management plan. Patient is aware to call the clinic if symptoms fail to improve or worsen. Patient is aware when to return to the clinic for a follow-up visit. Patient educated on when it is appropriate to go to the emergency department.   Madelyn Schick, NP-C

## 2023-08-04 ENCOUNTER — Other Ambulatory Visit: Payer: 59

## 2023-08-09 ENCOUNTER — Ambulatory Visit
Admission: RE | Admit: 2023-08-09 | Discharge: 2023-08-09 | Disposition: A | Discharge status: Home / Self Care | Payer: 59 | Source: Ambulatory Visit | Attending: Orthopaedic Surgery | Admitting: Orthopaedic Surgery

## 2023-08-09 DIAGNOSIS — M5126 Other intervertebral disc displacement, lumbar region: Secondary | ICD-10-CM | POA: Diagnosis not present

## 2023-08-09 DIAGNOSIS — G8929 Other chronic pain: Secondary | ICD-10-CM

## 2023-08-09 DIAGNOSIS — M47816 Spondylosis without myelopathy or radiculopathy, lumbar region: Secondary | ICD-10-CM | POA: Diagnosis not present

## 2023-08-17 ENCOUNTER — Encounter: Payer: Self-pay | Admitting: Orthopaedic Surgery

## 2023-09-08 ENCOUNTER — Ambulatory Visit: Admitting: Orthopaedic Surgery

## 2023-09-08 ENCOUNTER — Ambulatory Visit (INDEPENDENT_AMBULATORY_CARE_PROVIDER_SITE_OTHER): Admitting: Orthopaedic Surgery

## 2023-09-08 ENCOUNTER — Encounter: Payer: Self-pay | Admitting: Orthopaedic Surgery

## 2023-09-08 DIAGNOSIS — M48062 Spinal stenosis, lumbar region with neurogenic claudication: Secondary | ICD-10-CM | POA: Diagnosis not present

## 2023-09-08 DIAGNOSIS — M48061 Spinal stenosis, lumbar region without neurogenic claudication: Secondary | ICD-10-CM | POA: Insufficient documentation

## 2023-09-08 NOTE — Progress Notes (Signed)
 Office Visit Note   Patient: Jasmine Vaughan           Date of Birth: 1940-08-31           MRN: [id number] Visit Date: 09/08/2023              Requested by: Grayce Sessions, NP [address] [place],  [place] [address] PCP: Grayce Sessions, NP   Assessment & Plan: Visit Diagnoses:  1. Spinal stenosis of lumbar region with neurogenic claudication     Plan: Patient with L4-5 degenerative anterolisthesis spinal stenosis severe at that level and severe foraminal stenosis on the left.  We discussed surgery options which would likely be decompression and fusion.  She would need to come back and see Dr. Christell Constant in a few months and she will think about surgery.  We discussed usual technique and procedure.  She probably would be on a walker after surgery for a while.  She has good family support.  She will need lateral flexion-extension lumbar images as well as an AP x-ray on return when she sees Dr. Christell Constant.  Follow-Up Instructions: No follow-ups on file.   Orders:  No orders of the defined types were placed in this encounter.  No orders of the defined types were placed in this encounter.     Procedures: No procedures performed   Clinical Data: No additional findings.   Subjective: Chief Complaint  Patient presents with   Lower Back - Pain, Follow-up    MRI review    HPI 83 year old female returns she continues to have problems with pain in her back sometimes when she is sitting or sleep she gets relief of the back pain and left leg pain.  She sits on a stool intermittently while cooking.  She uses a grocery cart when she goes to the grocery store a [employer] and leans on it.  She states she has to sit if she does not have the cart after short distances otherwise her legs will give way.  MRI scan is available and is reviewed today with patient and her daughter as well as her son.  She has anterolisthesis at L4-5 and has not had flexion-extension lateral lumbar  images.  Review of Systems history of PACs not on any blood thinner.  Positive for hypertension she does have some urinary urgency.   Objective: Vital Signs: There were no vitals taken for this visit.  Physical Exam Constitutional:      Appearance: She is well-developed.  HENT:     Head: Normocephalic.     Right Ear: External ear normal.     Left Ear: External ear normal. There is no impacted cerumen.  Eyes:     Pupils: Pupils are equal, round, and reactive to light.  Neck:     Thyroid: No thyromegaly.     Trachea: No tracheal deviation.  Cardiovascular:     Rate and Rhythm: Normal rate.  Pulmonary:     Effort: Pulmonary effort is normal.  Abdominal:     Palpations: Abdomen is soft.  Musculoskeletal:     Cervical back: No rigidity.  Skin:    General: Skin is warm and dry.  Neurological:     Mental Status: She is alert and oriented to person, place, and time.  Psychiatric:        Behavior: Behavior normal.     Ortho Exam no pain with hip range of motion.  She has sciatic notch tenderness on the left mild trochanteric  bursal tenderness.  Positive popliteal compression test pain radiates down to her ankle.  Specialty Comments:  No specialty comments available.  Imaging: Narrative & Impression  CLINICAL DATA:  Low back pain for over 6 weeks   EXAM: MRI LUMBAR SPINE WITHOUT CONTRAST   TECHNIQUE: Multiplanar, multisequence MR imaging of the lumbar spine was performed. No intravenous contrast was administered.   COMPARISON:  None Available.   FINDINGS: Segmentation:  Standard.   Alignment: Levocurvature of the lumbar spine. Grade 1 anterolisthesis of L4 on L5.   Vertebrae: No acute fracture, evidence of discitis, or aggressive bone lesion.   Conus medullaris and cauda equina: Conus extends to the L1 level. Conus and cauda equina appear normal.   Paraspinal and other soft tissues: No acute paraspinal abnormality.   Disc levels:   Disc spaces: Moderate  disc height loss T12-L1, L1-2 and L4-5. Mild disc height loss at L2-3 and L3-4. Modic 1 endplate changes at L1-2 and L4-5.   T12-L1: Mild disc osteophyte complex. Mild bilateral facet arthropathy. No left foraminal stenosis. Mild right foraminal stenosis. No central canal stenosis.   L1-L2: Mild disc bulge. Moderate bilateral facet arthropathy. Mild central canal stenosis. Moderate-severe right foraminal stenosis. Mild left foraminal stenosis.   L2-L3: Mild disc bulge. Moderate bilateral facet arthropathy. Mild central canal stenosis. Mild central canal stenosis. Moderate right foraminal stenosis. Mild left foraminal stenosis.   L3-L4: Mild disc bulge. Moderate bilateral facet arthropathy. Moderate central canal stenosis. Mild bilateral foraminal stenosis. Bilateral lateral recess stenosis.   L4-L5: Moderate disc bulge. Severe bilateral facet arthropathy. Severe central canal stenosis. Severe left foraminal stenosis. Moderate right foraminal stenosis.   L5-S1: Mild disc bulge. Moderate bilateral facet arthropathy with bilateral facet effusions. Severe right foraminal stenosis. Mild left foraminal stenosis. No central canal stenosis.   IMPRESSION: 1. Diffuse lumbar spine spondylosis as described above. 2. No acute osseous injury of the lumbar spine.     Electronically Signed   By: Elige Ko M.D.   On: 08/19/2023 14:26     PMFS History: Patient Active Problem List   Diagnosis Date Noted   Spinal stenosis of lumbar region 09/08/2023   Low back pain 07/05/2023   Tinea versicolor 05/27/2023   Personal history of breast cancer 01/05/2023   Uncontrolled hypertension 12/29/2022   Mixed hyperlipidemia 12/29/2022   Osteoarthritis of left hip 12/29/2022   Urinary urgency 12/29/2022   PAC (premature atrial contraction) 12/29/2022   Past Medical History:  Diagnosis Date   Arthritis    Hypertension    Overactive bladder     Family History  Family history unknown: Yes     Past Surgical History:  Procedure Laterality Date   BREAST SURGERY     Social History   Occupational History   Not on file  Tobacco Use   Smoking status: Never   Smokeless tobacco: Never  Vaping Use   Vaping status: Never Used  Substance and Sexual Activity   Alcohol use: No   Drug use: No   Sexual activity: Not Currently

## 2023-10-25 ENCOUNTER — Encounter (INDEPENDENT_AMBULATORY_CARE_PROVIDER_SITE_OTHER): Payer: Self-pay | Admitting: Primary Care

## 2023-10-25 ENCOUNTER — Ambulatory Visit (INDEPENDENT_AMBULATORY_CARE_PROVIDER_SITE_OTHER): Payer: 59 | Admitting: Primary Care

## 2023-10-25 VITALS — BP 138/81 | HR 98 | Resp 16 | Wt 137.4 lb

## 2023-10-25 DIAGNOSIS — R7309 Other abnormal glucose: Secondary | ICD-10-CM | POA: Diagnosis not present

## 2023-10-25 DIAGNOSIS — G629 Polyneuropathy, unspecified: Secondary | ICD-10-CM | POA: Diagnosis not present

## 2023-10-25 DIAGNOSIS — E782 Mixed hyperlipidemia: Secondary | ICD-10-CM

## 2023-10-25 DIAGNOSIS — W19XXXA Unspecified fall, initial encounter: Secondary | ICD-10-CM

## 2023-10-25 DIAGNOSIS — I10 Essential (primary) hypertension: Secondary | ICD-10-CM | POA: Diagnosis not present

## 2023-10-25 MED ORDER — GABAPENTIN 100 MG PO CAPS: 100.0000 mg | ORAL_CAPSULE | Freq: Three times a day (TID) | ORAL | 1 refills | Status: DC

## 2023-10-25 NOTE — Progress Notes (Signed)
 Jasmine Vaughan  Jasmine Vaughan, is a 83 y.o. female  ZOX:[id number]  [id number]  DOB - January 06, 1941  Chief Complaint  Patient presents with   Fall    Last Friday on the right side    Hypertension       Subjective:   Jasmine Vaughan is a 83 y.o. female here today for a follow up visit primarily for hypertension which is controlled.  Morning.  Monitors updated over  930 or patient has No headache, No chest pain, No abdominal pain - No Nausea, No new weakness tingling or numbness, No Cough - shortness of breath.  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  She already has low back pain and osteoarthritis of the left hip which causes her  to have weakness and gives away at times.  She did not go to the emergency room for evaluation.  She is still in pain but feeling better.  She has numbness associated with it and tingling.  Requesting something to help with the pain.  No problems updated.  Comprehensive ROS Pertinent positive and negative noted in HPI   Allergies  Allergen Reactions   Bee Venom Other (See Comments)    Pt reports causes localized swelling, burning, itching   Other Other (See Comments)    Pt reports spiders cause localized swelling, burning, itching   Yellow Jacket Venom Other (See Comments)    Pt reports causes localized swelling, burning, itching   Wasp Venom Other (See Comments)    Pt reports causes localized swelling, burning, itching    Past Medical History:  Diagnosis Date   Arthritis    Hypertension    Overactive bladder     Current Outpatient Medications on File Prior to Visit  Medication Sig Dispense Refill   acetaminophen  (TYLENOL ) 325 MG tablet Take 650 mg by mouth every 6 (six) hours as needed.     hydrochlorothiazide  (MICROZIDE ) 12.5 MG capsule Take 1 capsule (12.5 mg total) by mouth daily. 90 capsule 3   oxybutynin  (DITROPAN -XL) 10 MG 24 hr tablet  Take 1 tablet (10 mg total) by mouth at bedtime. 90 tablet 3   No current facility-administered medications on file prior to visit.   Past Objective:   Vitals:   10/25/23 1347  BP: 138/81  Pulse: 98  Resp: 16  SpO2: 98%  Weight: 137 lb 6.4 oz (62.3 kg)   BP Readings from Last 3 Encounters:  10/25/23 138/81  07/25/23 (!) 148/89  07/01/23 131/87      Physical Exam Vitals reviewed.  Constitutional:      Appearance: Normal appearance.  HENT:     Head: Normocephalic.     Right Ear: Tympanic membrane, ear canal and external ear normal.     Left Ear: Tympanic membrane, ear canal and external ear normal.     Nose: Nose normal.     Mouth/Throat:     Mouth: Mucous membranes are moist.  Eyes:     Extraocular Movements: Extraocular movements intact.     Pupils: Pupils are equal, round, and reactive to light.  Cardiovascular:     Rate and Rhythm: Normal rate. Rhythm irregular.  Pulmonary:     Effort: Pulmonary effort is normal.     Breath sounds: Normal breath sounds.  Abdominal:     General: Bowel sounds are normal.     Palpations: Abdomen is soft.  Musculoskeletal:  General: Normal range of motion.     Cervical back: Normal range of motion.  Skin:    General: Skin is warm and dry.  Neurological:     Mental Status: She is alert and oriented to person, place, and time.  Psychiatric:        Mood and Affect: Mood normal.        Behavior: Behavior normal.        Thought Content: Thought content normal.   Patient [facility] hydrochlorothiazide  pravastatin  Assessment & Plan  Giabella was seen today for fall and hypertension.  Diagnoses and all orders for this visit:  Elevated glucose -     CMP14+EGFR -     Hemoglobin A1c  Essential hypertension Well-controlled on HCTZ once daily DIET: Limit salt intake, read nutrition labels to check salt content, limit fried and high fatty foods  Avoid using multisymptom OTC cold  preparations that generally contain sudafed which can rise BP. 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     Patient have been counseled extensively about nutrition and exercise. Other issues discussed during this visit include: low cholesterol diet, weight control and daily exercise, foot care, annual eye examinations at Ophthalmology, importance of adherence with medications and regular follow-up. We also discussed long term complications of uncontrolled diabetes and hypertension.   Schedule medicare wellness   The patient was given clear instructions to go to ER or return to medical center if symptoms don't improve, worsen or new problems develop. The patient verbalized understanding. The patient was told to call to get lab results if they haven't heard anything in the next week.   This note has been created with Education officer, environmental. Any transcriptional errors are unintentional.   Marius Siemens, NP 10/25/2023, 2:11 PM

## 2023-10-25 NOTE — Patient Instructions (Signed)
 Gabapentin Capsules or Tablets What is this medication? GABAPENTIN (GA ba pen tin) treats nerve pain. It may also be used to prevent and control seizures in people with epilepsy. It works by calming overactive nerves in your body. This medicine may be used for other purposes; ask your health care provider or pharmacist if you have questions. COMMON BRAND NAME(S): Active-PAC with Gabapentin, Ascencion Dike, Gralise, Neurontin What should I tell my care team before I take this medication? They need to know if you have any of these conditions: Kidney disease Lung or breathing disease Substance use disorder Suicidal thoughts, plans, or attempt by you or a family member An unusual or allergic reaction to gabapentin, other medications, foods, dyes, or preservatives Pregnant or trying to get pregnant Breastfeeding How should I use this medication? Take this medication by mouth with a glass of water. Follow the directions on the prescription label. You can take it with or without food. If it upsets your stomach, take it with food. Take your medication at regular intervals. Do not take it more often than directed. Do not stop taking except on your care team's advice. If you are directed to break the 600 or 800 mg tablets in half as part of your dose, the extra half tablet should be used for the next dose. If you have not used the extra half tablet within 28 days, it should be thrown away. A special MedGuide will be given to you by the pharmacist with each prescription and refill. Be sure to read this information carefully each time. Talk to your care team about the use of this medication in children. While this medication may be prescribed for children as young as 3 years for selected conditions, precautions do apply. Overdosage: If you think you have taken too much of this medicine contact a poison control center or emergency room at once. NOTE: This medicine is only for you. Do not share this medicine with  others. What if I miss a dose? If you miss a dose, take it as soon as you can. If it is almost time for your next dose, take only that dose. Do not take double or extra doses. What may interact with this medication? Alcohol Antihistamines for allergy, cough, and cold Certain medications for anxiety or sleep Certain medications for depression like amitriptyline, fluoxetine, sertraline Certain medications for seizures like phenobarbital, primidone Certain medications for stomach problems General anesthetics like halothane, isoflurane, methoxyflurane, propofol Local anesthetics like lidocaine, pramoxine, tetracaine Medications that relax muscles for surgery Opioid medications for pain Phenothiazines like chlorpromazine, mesoridazine, prochlorperazine, thioridazine This list may not describe all possible interactions. Give your health care provider a list of all the medicines, herbs, non-prescription drugs, or dietary supplements you use. Also tell them if you smoke, drink alcohol, or use illegal drugs. Some items may interact with your medicine. What should I watch for while using this medication? Visit your care team for regular checks on your progress. You may want to keep a record at home of how you feel your condition is responding to treatment. You may want to share this information with your care team at each visit. You should contact your care team if your seizures get worse or if you have any new types of seizures. Do not stop taking this medication or any of your seizure medications unless instructed by your care team. Stopping your medication suddenly can increase your seizures or their severity. This medication may cause serious skin reactions. They can happen weeks to  months after starting the medication. Contact your care team right away if you notice fevers or flu-like symptoms with a rash. The rash may be red or purple and then turn into blisters or peeling of the skin. Or, you might  notice a red rash with swelling of the face, lips or lymph nodes in your neck or under your arms. Wear a medical identification bracelet or chain if you are taking this medication for seizures. Carry a card that lists all your medications. This medication may affect your coordination, reaction time, or judgment. Do not drive or operate machinery until you know how this medication affects you. Sit up or stand slowly to reduce the risk of dizzy or fainting spells. Drinking alcohol with this medication can increase the risk of these side effects. Your mouth may get dry. Chewing sugarless gum or sucking hard candy, and drinking plenty of water may help. Watch for new or worsening thoughts of suicide or depression. This includes sudden changes in mood, behaviors, or thoughts. These changes can happen at any time but are more common in the beginning of treatment or after a change in dose. Call your care team right away if you experience these thoughts or worsening depression. If you become pregnant while using this medication, you may enroll in the [place] American Antiepileptic Drug Pregnancy Registry by calling [phone]. This registry collects information about the safety of antiepileptic medication use during pregnancy. What side effects may I notice from receiving this medication? Side effects that you should report to your care team as soon as possible: Allergic reactions or angioedema--skin rash, itching, hives, swelling of the face, eyes, lips, tongue, arms, or legs, trouble swallowing or breathing Rash, fever, and swollen lymph nodes Thoughts of suicide or self harm, worsening mood, feelings of depression Trouble breathing Unusual changes in mood or behavior in children after use such as difficulty concentrating, hostility, or restlessness Side effects that usually do not require medical attention (report to your care team if they continue or are  bothersome): Dizziness Drowsiness Nausea Swelling of ankles, feet, or hands Vomiting This list may not describe all possible side effects. Call your doctor for medical advice about side effects. You may report side effects to FDA at 1-800-FDA-1088. Where should I keep my medication? Keep out of reach of children and pets. Store at room temperature between 15 and 30 degrees C (59 and 86 degrees F). Get rid of any unused medication after the expiration date. This medication may cause accidental overdose and death if taken by other adults, children, or pets. To get rid of medications that are no longer needed or have expired: Take the medication to a medication take-back program. Check with your pharmacy or law enforcement to find a location. If you cannot return the medication, check the label or package insert to see if the medication should be thrown out in the garbage or flushed down the toilet. If you are not sure, ask your care team. If it is safe to put it in the trash, empty the medication out of the container. Mix the medication with cat litter, dirt, coffee grounds, or other unwanted substance. Seal the mixture in a bag or container. Put it in the trash. NOTE: This sheet is a summary. It may not cover all possible information. If you have questions about this medicine, talk to your doctor, pharmacist, or health care provider.  2024 Elsevier/Gold Standard (2022-03-16 00:00:00)

## 2023-10-26 LAB — CBC WITH DIFFERENTIAL/PLATELET
Basophils Absolute: 0 10*3/uL (ref 0.0–0.2)
Basos: 1 %
EOS (ABSOLUTE): 0.1 10*3/uL (ref 0.0–0.4)
Eos: 3 %
Hematocrit: 41.7 % (ref 34.0–46.6)
Hemoglobin: 13.6 g/dL (ref 11.1–15.9)
Immature Grans (Abs): 0 10*3/uL (ref 0.0–0.1)
Immature Granulocytes: 0 %
Lymphocytes Absolute: 1.8 10*3/uL (ref 0.7–3.1)
Lymphs: 42 %
MCH: 28.3 pg (ref 26.6–33.0)
MCHC: 32.6 g/dL (ref 31.5–35.7)
MCV: 87 fL (ref 79–97)
Monocytes Absolute: 0.5 10*3/uL (ref 0.1–0.9)
Monocytes: 12 %
Neutrophils Absolute: 1.8 10*3/uL (ref 1.4–7.0)
Neutrophils: 42 %
Platelets: 381 10*3/uL (ref 150–450)
RBC: 4.8 x10E6/uL (ref 3.77–5.28)
RDW: 13 % (ref 11.7–15.4)
WBC: 4.4 10*3/uL (ref 3.4–10.8)

## 2023-10-26 LAB — CMP14+EGFR
ALT: 21 IU/L (ref 0–32)
AST: 19 IU/L (ref 0–40)
Albumin: 4.1 g/dL (ref 3.7–4.7)
Alkaline Phosphatase: 79 IU/L (ref 44–121)
BUN/Creatinine Ratio: 23 (ref 12–28)
BUN: 15 mg/dL (ref 8–27)
Bilirubin Total: <0.2 mg/dL (ref 0.0–1.2)
CO2: 23 mmol/L (ref 20–29)
Calcium: 9.2 mg/dL (ref 8.7–10.3)
Chloride: 101 mmol/L (ref 96–106)
Creatinine, Ser: 0.66 mg/dL (ref 0.57–1.00)
Globulin, Total: 3.1 g/dL (ref 1.5–4.5)
Glucose: 129 mg/dL — ABNORMAL HIGH (ref 70–99)
Potassium: 4 mmol/L (ref 3.5–5.2)
Sodium: 139 mmol/L (ref 134–144)
Total Protein: 7.2 g/dL (ref 6.0–8.5)
eGFR: 88 mL/min/{1.73_m2} (ref >59)

## 2023-10-26 LAB — LIPID PANEL
Chol/HDL Ratio: 3 ratio (ref 0.0–4.4)
Cholesterol, Total: 157 mg/dL (ref 100–199)
HDL: 53 mg/dL (ref >39)
LDL Chol Calc (NIH): 75 mg/dL (ref 0–99)
Triglycerides: 169 mg/dL — ABNORMAL HIGH (ref 0–149)
VLDL Cholesterol Cal: 29 mg/dL (ref 5–40)

## 2023-10-26 LAB — HEMOGLOBIN A1C
Est. average glucose Bld gHb Est-mCnc: 126 mg/dL
Hgb A1c MFr Bld: 6 % — ABNORMAL HIGH (ref 4.8–5.6)

## 2023-10-28 ENCOUNTER — Ambulatory Visit (INDEPENDENT_AMBULATORY_CARE_PROVIDER_SITE_OTHER): Payer: Self-pay | Admitting: Primary Care

## 2023-11-10 ENCOUNTER — Telehealth (INDEPENDENT_AMBULATORY_CARE_PROVIDER_SITE_OTHER): Payer: Self-pay | Admitting: Primary Care

## 2023-11-10 NOTE — Telephone Encounter (Signed)
 Called pt to schedule follow up appt. Pt will be present at appt.

## 2023-11-21 ENCOUNTER — Encounter: Admitting: Orthopedic Surgery

## 2023-12-05 ENCOUNTER — Other Ambulatory Visit (INDEPENDENT_AMBULATORY_CARE_PROVIDER_SITE_OTHER): Payer: Self-pay | Admitting: Primary Care

## 2023-12-08 ENCOUNTER — Ambulatory Visit: Admitting: Orthopedic Surgery

## 2023-12-08 ENCOUNTER — Other Ambulatory Visit (INDEPENDENT_AMBULATORY_CARE_PROVIDER_SITE_OTHER): Payer: Self-pay

## 2023-12-08 ENCOUNTER — Encounter: Admitting: Orthopedic Surgery

## 2023-12-08 VITALS — BP 156/74 | HR 68 | Ht 65.0 in | Wt 138.0 lb

## 2023-12-08 DIAGNOSIS — M545 Low back pain, unspecified: Secondary | ICD-10-CM

## 2023-12-08 NOTE — Progress Notes (Signed)
 Orthopedic Spine Surgery Office Note  Assessment: Patient is a 83 y.o. female with low back pain that radiates into the left lateral thigh.  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  However, she has been able to manage the pain with gabapentin .  She is only on 100 mg dose.  Accordingly, I told her we have room to go up on that.  If that does not help her, then the next treatment would be a L4/5 transforaminal injection on the left side.  Ultimately, if all those treatments do not work and her pain is significant that it interferes with her daily life and quality of life, would recommend surgical intervention. -Patient is going to call me or send a message via MyChart if her pain does worsen and we can talk about next steps in treatment -Patient should return to office on an as-needed basis   Patient expressed understanding of the plan and all questions were answered to the patient's satisfaction.   ___________________________________________________________________________   History:  Patient is a 83 y.o. female who presents today for lumbar spine.  Patient has had about 9 months of low back and left lateral thigh pain.  She also has pain over the anterior knee and proximal anterolateral leg.  There is no trauma or injury that preceded the onset of the pain.  She feels the pain within activity and at rest.  It sometimes is better if she can sit herself in the right position.  But most of the time, she is dealing with the pain.  She has tried several treatments but has not noticed any relief until she started taking gabapentin .  She has found the gabapentin  helpful.  She is on 100 mg 3 times daily dose right now.  She has no pain radiating into the right lower extremity.   Weakness: Denies Symptoms of imbalance: Denies Paresthesias and numbness: Yes,  notices paresthesias over the lateral thigh and anterior knee.  No other numbness or paresthesias Bowel or bladder incontinence: Denies Saddle anesthesia: Denies  Treatments tried: PT, Tylenol , Aleve , gabapentin   Review of systems: Denies fevers and chills, night sweats, unexplained weight loss, history of cancer, pain that wakes her at night  Past medical history: HTN Overactive bladder  Allergies: NKDA  Past surgical history:  Breast surgery  Social history: Denies use of nicotine product (smoking, vaping, patches, smokeless) Alcohol use: Denies Denies recreational drug use   Physical Exam:  BMI of 23.0  General: no acute distress, appears stated age Neurologic: alert, answering questions appropriately, following commands Respiratory: unlabored breathing on room air, symmetric chest rise Psychiatric: appropriate affect, normal cadence to speech   MSK (spine):  -Strength exam      Left  Right EHL    5/5  5/5 TA    5/5  5/5 GSC    5/5  5/5 Knee extension  5/5  5/5 Hip flexion   5/5  5/5  -Sensory exam    Sensation intact to light touch in L3-S1 nerve distributions of bilateral lower extremities  -Achilles DTR: 1/4 on the left, 1/4 on the right -Patellar tendon DTR: 1/4 on the left, 1/4 on the right  -Straight leg raise: Negative bilaterally -Clonus: no beats bilaterally  -Left hip exam: No pain through range of motion -Right hip exam: No pain through range of motion  Imaging: XRs of the lumbar spine from 12/08/2023 were independently reviewed and interpreted, showing disc height loss at L4/5. Lateral listhesis at L4/5. Degenerative lumbar scoliosis. Spondylolisthesis at L4/5. No fracture or dislocation seen.   MRI of the lumbar spine from 08/09/2023 was independently reviewed and interpreted, showing mild central stenosis at L3/4.  DDD and spondylolisthesis at L4/5.  Central, bilateral lateral recess stenosis at L4/5.  Left-sided foraminal stenosis at  L4/5.   Patient name: Jasmine Vaughan Patient MRN: [id number] Date of visit: 12/08/23

## 2024-01-17 ENCOUNTER — Ambulatory Visit

## 2024-01-25 ENCOUNTER — Ambulatory Visit (INDEPENDENT_AMBULATORY_CARE_PROVIDER_SITE_OTHER): Admitting: Primary Care

## 2024-01-25 ENCOUNTER — Other Ambulatory Visit: Payer: 59

## 2024-01-25 ENCOUNTER — Encounter (INDEPENDENT_AMBULATORY_CARE_PROVIDER_SITE_OTHER): Payer: Self-pay | Admitting: Primary Care

## 2024-01-25 VITALS — BP 132/70 | HR 88 | Resp 20 | Ht 65.0 in | Wt 141.6 lb

## 2024-01-25 DIAGNOSIS — I10 Essential (primary) hypertension: Secondary | ICD-10-CM

## 2024-01-25 DIAGNOSIS — H6122 Impacted cerumen, left ear: Secondary | ICD-10-CM

## 2024-01-25 DIAGNOSIS — E782 Mixed hyperlipidemia: Secondary | ICD-10-CM | POA: Diagnosis not present

## 2024-01-25 NOTE — Progress Notes (Signed)
 [facility]   Jasmine Vaughan is a 83 y.o. female presents for hypertension evaluation, previous appointment blood pressure was elevated she is on hydrochlorothiazide  12.5 which she has been taking daily today blood pressure is 132/70 unremarkable.  Denies shortness of breath, headaches, chest pain or lower extremity edema, sudden onset, vision changes, unilateral weakness, dizziness, paresthesias  Follow-up visit we will take fasting lipids Patient reports adherence with medications.  Past Medical History:  Diagnosis Date   Arthritis    Hypertension    Overactive bladder    Past Surgical History:  Procedure Laterality Date   BREAST SURGERY     Allergies  Allergen Reactions   Bee Venom Other (See Comments)    Pt reports causes localized swelling, burning, itching   Other Other (See Comments)    Pt reports spiders cause localized swelling, burning, itching   Yellow Jacket Venom Other (See Comments)    Pt reports causes localized swelling, burning, itching   Wasp Venom Other (See Comments)    Pt reports causes localized swelling, burning, itching   Current Outpatient Medications on File Prior to Visit  Medication Sig Dispense Refill   acetaminophen  (TYLENOL ) 325 MG tablet Take 650 mg by mouth every 6 (six) hours as needed.     gabapentin  (NEURONTIN ) 100 MG capsule TAKE 1 Capsule BY MOUTH THREE TIMES DAILY 90 capsule 1   hydrochlorothiazide  (MICROZIDE ) 12.5 MG capsule Take 1 capsule (12.5 mg total) by mouth daily. 90 capsule 3   oxybutynin  (DITROPAN -XL) 10 MG 24 hr tablet Take 1 tablet (10 mg total) by mouth at bedtime. 90 tablet 3   pravastatin (PRAVACHOL) 20 MG tablet Take 20 mg by mouth daily.     No current facility-administered medications on file prior to visit.   Social History   Socioeconomic History   Marital status: Widowed    Spouse name: Not on file   Number of children: Not on file   Years of education: Not on file   Highest education level:  Not on file  Occupational History   Not on file  Tobacco Use   Smoking status: Never   Smokeless tobacco: Never  Vaping Use   Vaping status: Never Used  Substance and Sexual Activity   Alcohol use: No   Drug use: No   Sexual activity: Not Currently  Other Topics Concern   Not on file  Social History Narrative   Not on file   Social Drivers of Health   Financial Resource Strain: Not on file  Food Insecurity: No Food Insecurity (07/25/2023)   Hunger Vital Sign    Worried About Running Out of Food in the Last Year: Never true    Ran Out of Food in the Last Year: Never true  Transportation Needs: No Transportation Needs (07/25/2023)   PRAPARE - Administrator, Civil Service (Medical): No    Lack of Transportation (Non-Medical): No  Physical Activity: Not on file  Stress: Not on file  Social Connections: Moderately Isolated (12/29/2022)   Social Connection and Isolation Panel    Frequency of Communication with Friends and Family: More than three times a week    Frequency of Social Gatherings with Friends and Family: More than three times a week    Attends Religious Services: 1 to 4 times per year    Active Member of [employer] or Organizations: No    Attends Banker Meetings: Never    Marital Status: Widowed  Intimate Partner Violence: Not At Risk (  07/25/2023)   Humiliation, Afraid, Rape, and Kick questionnaire    Fear of Current or Ex-Partner: No    Emotionally Abused: No    Physically Abused: No    Sexually Abused: No   Family History  Family history unknown: Yes   Health Maintenance  Topic Date Due   Medicare Annual Wellness (AWV)  05/19/2019   COVID-19 Vaccine (5 - 2024-25 season) 02/13/2023   INFLUENZA VACCINE  01/13/2024   Zoster Vaccines- Shingrix (1 of 2) 04/26/2024 (Originally [birth year]/13/1992)   DTaP/Tdap/Td (1 - Tdap) 07/24/2024 (Originally 03/13/1960)   Pneumococcal Vaccine: 50+ Years (1 of 1 - PCV) 07/24/2024 (Originally 03/13/1992)   DEXA SCAN   Completed   HPV VACCINES  Aged Out   Meningococcal B Vaccine  Aged Out     OBJECTIVE:  Vitals:   01/25/24 1333  BP: 132/70  Pulse: 88  Resp: 20  SpO2: 100%  Weight: 141 lb 9.6 oz (64.2 kg)  Height: 5' 5 (1.651 m)    Physical Exam Constitutional:      Appearance: Normal appearance.  HENT:     Head: Normocephalic.     Right Ear: Tympanic membrane, ear canal and external ear normal.     Left Ear: External ear normal. There is impacted cerumen.     Ears:     Comments: Left ear cerumen impaction    Nose: Nose normal.  Eyes:     Extraocular Movements: Extraocular movements intact.  Cardiovascular:     Rate and Rhythm: Normal rate and regular rhythm.  Abdominal:     General: Bowel sounds are normal.     Palpations: Abdomen is soft.  Musculoskeletal:        General: Normal range of motion.     Cervical back: Normal range of motion.  Skin:    General: Skin is warm and dry.  Neurological:     Mental Status: She is alert and oriented to person, place, and time.  Psychiatric:        Mood and Affect: Mood normal.        Behavior: Behavior normal.        Thought Content: Thought content normal.      ROS  Last 3 Office BP readings: BP Readings from Last 3 Encounters:  01/25/24 132/70  12/08/23 (!) 156/74  10/25/23 138/81    BMET    Component Value Date/Time   NA 139 10/25/2023 1359   K 4.0 10/25/2023 1359   CL 101 10/25/2023 1359   CO2 23 10/25/2023 1359   GLUCOSE 129 (H) 10/25/2023 1359   GLUCOSE 123 (H) 05/23/2018 1049   BUN 15 10/25/2023 1359   CREATININE 0.66 10/25/2023 1359   CALCIUM 9.2 10/25/2023 1359   GFRNONAA >60 05/23/2018 1049   GFRAA >60 05/23/2018 1049    Renal function: CrCl cannot be calculated (Patient's most recent lab result is older than the maximum 21 days allowed.).  Clinical ASCVD: No  The ASCVD Risk score (Arnett DK, et al., 2019) failed to calculate for the following reasons:   The 2019 ASCVD risk score is only valid for ages  12 to 71  ASCVD risk factors include- ITALY   ASSESSMENT & PLAN:  Jasmine Vaughan was seen today for hypertension.  Diagnoses and all orders for this visit:  Essential hypertension -Counseled on lifestyle modifications for blood pressure control including reduced dietary sodium, increased exercise, weight reduction and adequate sleep. Also, educated patient about the risk for cardiovascular events, stroke and heart attack. Also counseled patient  about the importance of medication adherence. If you participate in smoking, it is important to stop using tobacco as this will increase the risks associated with uncontrolled blood pressure. -     CMP14+EGFR; Future  Mixed hyperlipidemia -     Lipid panel; Future  Left ear impacted cerumen  Ceruminosis is noted.  Wax is removed by manual debridement. Instructions for home care to prevent wax buildup are given.   Okay to proceed minimally follow thank you okay thank this a little bit drop his dry now get it from the surrounding bathroom around irrigated so do not hurt.  You did not push to keep it clean yeah yeah okay hello this is to the right over it but something is soft take.  Okay so if he is taking okay take 8 breast for 6 breath okay okay right probably take hold maybe 5 states so family see here This note has been created with Education officer, environmental. Any transcriptional errors are unintentional.   Jasmine SHAUNNA Bohr, NP 01/29/2024, 12:07 AM

## 2024-02-06 ENCOUNTER — Other Ambulatory Visit (INDEPENDENT_AMBULATORY_CARE_PROVIDER_SITE_OTHER): Payer: Self-pay | Admitting: Primary Care

## 2024-02-07 ENCOUNTER — Other Ambulatory Visit (INDEPENDENT_AMBULATORY_CARE_PROVIDER_SITE_OTHER): Payer: Self-pay | Admitting: Primary Care

## 2024-02-07 NOTE — Telephone Encounter (Unsigned)
 Copied from CRM [id number]. Topic: Clinical - Medication Refill >> Feb 07, 2024  4:01 PM Delon DASEN wrote: Patient no longer see original prescriber for 3 of the medications Medication: pravastatin  (PRAVACHOL ) 20 MG tablet oxybutynin  (DITROPAN -XL) 10 MG 24 hr tablet hydrochlorothiazide  (MICROZIDE ) 12.5 MG capsule gabapentin  (NEURONTIN ) 100 MG capsule  Has the patient contacted their pharmacy? Yes (Agent: If no, request that the patient contact the pharmacy for the refill. If patient does not wish to contact the pharmacy document the reason why and proceed with request.) (Agent: If yes, when and what did the pharmacy advise?)  This is the patient's preferred pharmacy:  [facility] - [place], [place] - [name] [place] [address] Phone: [phone] Fax: [phone]  Is this the correct pharmacy for this prescription? Yes If no, delete pharmacy and type the correct one.   Has the prescription been filled recently? Yes  Is the patient out of the medication? Yes  Has the patient been seen for an appointment in the last year OR does the patient have an upcoming appointment? Yes  Can we respond through MyChart? Yes  Agent: Please be advised that Rx refills may take up to 3 business days. We ask that you follow-up with your pharmacy.

## 2024-02-07 NOTE — Telephone Encounter (Signed)
 Requested medication (s) are due for refill today: No  Requested medication (s) are on the active medication list: Yes  Last refill:  01/03/24  Future visit scheduled: Yes  Notes to clinic:  Historical provider.    Requested Prescriptions  Pending Prescriptions Disp Refills   pravastatin  (PRAVACHOL ) 20 MG tablet [Pharmacy Med Name: pravastatin  20 mg tablet] 30 tablet 0    Sig: Take 1 Tablet by mouth once daily     Cardiovascular:  Antilipid - Statins Failed - 02/07/2024  3:27 PM      Failed - Lipid Panel in normal range within the last 12 months    Cholesterol, Total  Date Value Ref Range Status  10/25/2023 157 100 - 199 mg/dL Final   LDL Chol Calc (NIH)  Date Value Ref Range Status  10/25/2023 75 0 - 99 mg/dL Final   HDL  Date Value Ref Range Status  10/25/2023 53 >39 mg/dL Final   Triglycerides  Date Value Ref Range Status  10/25/2023 169 (H) 0 - 149 mg/dL Final         Passed - Patient is not pregnant      Passed - Valid encounter within last 12 months    Recent Outpatient Visits           1 week ago Essential hypertension   [place] [facility] Celestia Rosaline SQUIBB, NP   3 months ago Elevated glucose   [place] [facility] Celestia Rosaline SQUIBB, NP   6 months ago Encounter to establish care   [place] [facility] Celestia Rosaline SQUIBB, NP

## 2024-02-08 ENCOUNTER — Other Ambulatory Visit (INDEPENDENT_AMBULATORY_CARE_PROVIDER_SITE_OTHER): Payer: Self-pay | Admitting: Primary Care

## 2024-02-09 MED ORDER — PRAVASTATIN SODIUM 20 MG PO TABS: 20.0000 mg | ORAL_TABLET | Freq: Every day | ORAL | 1 refills | Status: DC

## 2024-02-09 MED ORDER — GABAPENTIN 100 MG PO CAPS: 100.0000 mg | ORAL_CAPSULE | Freq: Three times a day (TID) | ORAL | 1 refills | Status: DC

## 2024-02-09 NOTE — Telephone Encounter (Signed)
 Requested Prescriptions  Pending Prescriptions Disp Refills   pravastatin  (PRAVACHOL ) 20 MG tablet 90 tablet 1    Sig: Take 1 tablet (20 mg total) by mouth daily.     Cardiovascular:  Antilipid - Statins Failed - 02/09/2024 11:49 AM      Failed - Lipid Panel in normal range within the last 12 months    Cholesterol, Total  Date Value Ref Range Status  10/25/2023 157 100 - 199 mg/dL Final   LDL Chol Calc (NIH)  Date Value Ref Range Status  10/25/2023 75 0 - 99 mg/dL Final   HDL  Date Value Ref Range Status  10/25/2023 53 >39 mg/dL Final   Triglycerides  Date Value Ref Range Status  10/25/2023 169 (H) 0 - 149 mg/dL Final         Passed - Patient is not pregnant      Passed - Valid encounter within last 12 months    Recent Outpatient Visits           2 weeks ago Essential hypertension   [place] [facility] Celestia Rosaline SQUIBB, NP   3 months ago Elevated glucose   [place] [facility] Celestia Rosaline SQUIBB, NP   6 months ago Encounter to establish care   [place] [facility] Celestia Rosaline SQUIBB, NP               gabapentin  (NEURONTIN ) 100 MG capsule 180 capsule 1    Sig: Take 1 capsule (100 mg total) by mouth 3 (three) times daily.     Neurology: Anticonvulsants - gabapentin  Passed - 02/09/2024 11:49 AM      Passed - Cr in normal range and within 360 days    Creatinine, Ser  Date Value Ref Range Status  10/25/2023 0.66 0.57 - 1.00 mg/dL Final         Passed - Completed PHQ-2 or PHQ-9 in the last 360 days      Passed - Valid encounter within last 12 months    Recent Outpatient Visits           2 weeks ago Essential hypertension   [place] [facility] Celestia Rosaline SQUIBB, NP   3 months ago Elevated glucose   [place] [facility] Celestia Rosaline SQUIBB, NP   6 months ago Encounter to establish care   [place] [facility] Celestia Rosaline SQUIBB, NP               Refused Prescriptions Disp Refills   oxybutynin  (DITROPAN -XL) 10 MG 24 hr tablet 90 tablet 3    Sig: Take 1 tablet (10 mg total) by mouth at bedtime.     Urology:  Bladder Agents Passed - 02/09/2024 11:49 AM      Passed - Valid encounter within last 12 months    Recent Outpatient Visits           2 weeks ago Essential hypertension   [place] [facility] Celestia Rosaline SQUIBB, NP   3 months ago Elevated glucose   [place] [facility] Celestia Rosaline SQUIBB, NP   6 months ago Encounter to establish care   [place] [facility] Celestia Rosaline SQUIBB, NP               hydrochlorothiazide  (MICROZIDE ) 12.5 MG capsule 90 capsule 3    Sig: Take 1 capsule (12.5 mg total) by mouth daily.     Cardiovascular: Diuretics - Thiazide  Passed - 02/09/2024 11:49 AM      Passed - Cr in normal range and within 180 days    Creatinine, Ser  Date Value Ref Range Status  10/25/2023 0.66 0.57 - 1.00 mg/dL Final         Passed - K in normal range and within 180 days    Potassium  Date Value Ref Range Status  10/25/2023 4.0 3.5 - 5.2 mmol/L Final         Passed - Na in normal range and within 180 days    Sodium  Date Value Ref Range Status  10/25/2023 139 134 - 144 mmol/L Final         Passed - Last BP in normal range    BP Readings from Last 1 Encounters:  01/25/24 132/70         Passed - Valid encounter within last 6 months    Recent Outpatient Visits           2 weeks ago Essential hypertension   [place] [facility] Celestia Rosaline SQUIBB, NP   3 months ago Elevated glucose   [place] [facility] Celestia Rosaline SQUIBB, NP   6 months ago Encounter to establish care   [place] [facility] Celestia Rosaline SQUIBB, NP

## 2024-02-14 ENCOUNTER — Telehealth: Payer: Self-pay

## 2024-02-14 NOTE — Telephone Encounter (Signed)
 Copied from CRM [id number]. Topic: General - Other >> Feb 14, 2024  2:07 PM Delon DASEN wrote: Reason for CRM: returning call from office- [phone]

## 2024-02-15 NOTE — Telephone Encounter (Signed)
 Spoke with patient regarding message left.     Advised that I did not see where anyone had tried to call her unless it was regarding medication. Looking at recent documentation from [place] nurses. She informed this nurse that she has all of her medication.  Reminded of appointment in November. She stated she may want to adjust gabapentin  because she has time when it feels like her leg wants to give out but she stated she will wait until her next appointment.   She will continue to use a cane with ambulation.  She states prior to walking she stands still for a little while and then will begin to walk. She will call Ortho if she changes mind about surgery (last discussion).   Patient grateful for the call placed to her just to verify with her.

## 2024-02-16 NOTE — Telephone Encounter (Signed)
 TY

## 2024-03-12 ENCOUNTER — Other Ambulatory Visit (INDEPENDENT_AMBULATORY_CARE_PROVIDER_SITE_OTHER): Payer: Self-pay | Admitting: Primary Care

## 2024-04-16 ENCOUNTER — Encounter: Payer: Self-pay | Admitting: Radiology

## 2024-04-26 ENCOUNTER — Telehealth: Payer: Self-pay

## 2024-04-26 ENCOUNTER — Encounter (INDEPENDENT_AMBULATORY_CARE_PROVIDER_SITE_OTHER): Payer: Self-pay | Admitting: Primary Care

## 2024-04-26 ENCOUNTER — Ambulatory Visit (INDEPENDENT_AMBULATORY_CARE_PROVIDER_SITE_OTHER): Admitting: Primary Care

## 2024-04-26 VITALS — BP 140/92 | HR 79 | Resp 16 | Wt 144.0 lb

## 2024-04-26 DIAGNOSIS — I10 Essential (primary) hypertension: Secondary | ICD-10-CM | POA: Diagnosis not present

## 2024-04-26 DIAGNOSIS — L720 Epidermal cyst: Secondary | ICD-10-CM

## 2024-04-26 DIAGNOSIS — R3915 Urgency of urination: Secondary | ICD-10-CM

## 2024-04-26 DIAGNOSIS — G629 Polyneuropathy, unspecified: Secondary | ICD-10-CM

## 2024-04-26 DIAGNOSIS — B372 Candidiasis of skin and nail: Secondary | ICD-10-CM | POA: Diagnosis not present

## 2024-04-26 MED ORDER — GABAPENTIN 100 MG PO CAPS: 100.0000 mg | ORAL_CAPSULE | Freq: Three times a day (TID) | ORAL | 1 refills | Status: DC

## 2024-04-26 MED ORDER — CLOTRIMAZOLE-BETAMETHASONE 1-0.05 % EX CREA: 1.0000 | TOPICAL_CREAM | Freq: Every day | CUTANEOUS | 0 refills | Status: AC

## 2024-04-26 NOTE — Telephone Encounter (Signed)
 Copied from CRM #[id number]. Topic: Clinical - Prescription Issue >> Apr 26, 2024  3:30 PM Viola F wrote: Reason for CRM: My pharmacy called regarding the gabapentin  (NEURONTIN ) 100 MG capsule - there's two directions on the script and they need to clarify which one to use. Please call [phone] (Work)

## 2024-04-26 NOTE — Telephone Encounter (Signed)
 Per [employer] pharmacist he had spoke to office and sig. Should be Take 1 capsule in the morning and at lunch and take 2 capsules at bedtime (100 mg)

## 2024-04-26 NOTE — Progress Notes (Signed)
 [facility]  Jasmine Vaughan, is a 83 y.o. female  RDW:[id number]  FMW:[id number]  DOB - 20-Dec-1940  Chief Complaint  Patient presents with   Hypertension       Subjective:   Jasmine Vaughan is a 83 y.o. female here today for an acute visit.  She is concerned with a rash on and under her breasts and between explained this was a fungal infection which she likes dark skin that is overlapping friction and moisture.  She also admits to changing detergents she likes [address]  Question which 1 she is using now and did she have this rash before the switch of detergents.  She has had this rash unknown but was much smaller last month. Also upper thigh has a harden cyst/mass tender to touch protrude no discharge irritation when putting on clothes -started the beginning of the summer thought it was a tick -  and it wasn't ( she was working in her garden) it's now enlarging. Decided to leave it alone until it caused more irritation. At that time will refer to general surgeon.  HPI  No problems updated.  Comprehensive ROS Pertinent positive and negative noted in HPI   Allergies  Allergen Reactions   Bee Venom Other (See Comments)    Pt reports causes localized swelling, burning, itching   Other Other (See Comments)    Pt reports spiders cause localized swelling, burning, itching   Yellow Jacket Venom Other (See Comments)    Pt reports causes localized swelling, burning, itching   Wasp Venom Other (See Comments)    Pt reports causes localized swelling, burning, itching    Past Medical History:  Diagnosis Date   Arthritis    Hypertension    Overactive bladder     Current Outpatient Medications on File Prior to Visit  Medication Sig Dispense Refill   acetaminophen  (TYLENOL ) 325 MG tablet Take 650 mg by mouth every 6 (six) hours as needed.     hydrochlorothiazide  (MICROZIDE ) 12.5 MG capsule Take 1 capsule (12.5 mg total) by mouth daily. 90 capsule 3    oxybutynin  (DITROPAN -XL) 10 MG 24 hr tablet Take 1 tablet (10 mg total) by mouth at bedtime. 90 tablet 3   pravastatin  (PRAVACHOL ) 20 MG tablet Take 1 tablet (20 mg total) by mouth daily. 90 tablet 1   No current facility-administered medications on file prior to visit.   Health Maintenance  Topic Date Due   Medicare Annual Wellness Visit  05/19/2019   COVID-19 Vaccine (5 - 2025-26 season) 02/13/2024   Zoster (Shingles) Vaccine (1 of 2) 04/26/2024*   DTaP/Tdap/Td vaccine (1 - Tdap) 07/24/2024*   Pneumococcal Vaccine for age over 75 (1 of 1 - PCV) 07/24/2024*   Flu Shot  09/11/2024*   DEXA scan (bone density measurement)  Completed   Meningitis B Vaccine  Aged Out  *Topic was postponed. The date shown is not the original due date.    Objective:   Vitals:   04/26/24 1407  BP: (!) 188/99  Pulse: 79  Resp: 16  SpO2: 100%  Weight: 144 lb (65.3 kg)   BP (!) 188/99   Pulse 79   Resp 16   Wt 144 lb (65.3 kg)   SpO2 100%   BMI 23.96 kg/m    Physical Exam Vitals reviewed.  Constitutional:      Appearance: Normal appearance.  HENT:     Head: Normocephalic.     Right Ear: Tympanic membrane, ear canal and external ear normal.  Left Ear: Tympanic membrane, ear canal and external ear normal.     Nose: Nose normal.     Mouth/Throat:     Mouth: Mucous membranes are moist.  Eyes:     Extraocular Movements: Extraocular movements intact.     Pupils: Pupils are equal, round, and reactive to light.  Cardiovascular:     Rate and Rhythm: Normal rate.  Pulmonary:     Effort: Pulmonary effort is normal.     Breath sounds: Normal breath sounds.  Abdominal:     General: Bowel sounds are normal.     Palpations: Abdomen is soft.  Musculoskeletal:        General: Normal range of motion.     Cervical back: Normal range of motion.  Skin:    General: Skin is warm and dry.     Findings: Rash present.     Comments: Induration thight  Fungal breast   Neurological:     Mental Status:  She is alert and oriented to person, place, and time.  Psychiatric:        Mood and Affect: Mood normal.        Behavior: Behavior normal.        Thought Content: Thought content normal.     Assessment & Plan  Sohana was seen today for hypertension.  Diagnoses and all orders for this visit:  Candidal intertrigo -     clotrimazole-betamethasone (LOTRISONE) cream; Apply 1 Application topically daily.  Epidermoid cyst of skin of thigh See HPI/PE     Patient have been counseled extensively about nutrition and exercise. Other issues discussed during this visit include: low cholesterol diet, weight control and daily exercise, foot care, annual eye examinations at Ophthalmology, importance of adherence with medications and regular follow-up. We also discussed long term complications of uncontrolled diabetes and hypertension.   No follow-ups on file.  The patient was given clear instructions to go to ER or return to medical center if symptoms don't improve, worsen or new problems develop. The patient verbalized understanding. The patient was told to call to get lab results if they haven't heard anything in the next week.   This note has been created with Education officer, environmental. Any transcriptional errors are unintentional.   Rosaline SHAUNNA Bohr, NP 04/26/2024, 2:28 PM

## 2024-04-27 ENCOUNTER — Other Ambulatory Visit (INDEPENDENT_AMBULATORY_CARE_PROVIDER_SITE_OTHER): Payer: Self-pay

## 2024-04-27 DIAGNOSIS — G629 Polyneuropathy, unspecified: Secondary | ICD-10-CM

## 2024-04-27 NOTE — Telephone Encounter (Signed)
 reached out to [facility] at [facility] and he stated when we spoke yesterday he made note in pt chart but if we want we can send a new rx over with the correct directions.  Will forward to provider

## 2024-04-29 NOTE — Telephone Encounter (Signed)
 100mg  capsule  1 capsule in the morning and at lunch and take 2 capsules at bedtime (100 mg) pt pain worst at bedtime

## 2024-04-30 ENCOUNTER — Other Ambulatory Visit (INDEPENDENT_AMBULATORY_CARE_PROVIDER_SITE_OTHER): Payer: Self-pay

## 2024-04-30 DIAGNOSIS — G629 Polyneuropathy, unspecified: Secondary | ICD-10-CM

## 2024-04-30 MED ORDER — GABAPENTIN 100 MG PO CAPS: 100.0000 mg | ORAL_CAPSULE | Freq: Four times a day (QID) | ORAL | 1 refills | Status: AC

## 2024-04-30 NOTE — Telephone Encounter (Signed)
 Rx has been resent

## 2024-06-08 ENCOUNTER — Other Ambulatory Visit (INDEPENDENT_AMBULATORY_CARE_PROVIDER_SITE_OTHER): Payer: Self-pay | Admitting: Primary Care

## 2024-06-11 NOTE — Telephone Encounter (Signed)
 Requested Prescriptions  Pending Prescriptions Disp Refills   pravastatin  (PRAVACHOL ) 20 MG tablet [Pharmacy Med Name: pravastatin  20 mg tablet] 90 tablet 1    Sig: TAKE 1 Tablet BY MOUTH ONCE DAILY     Cardiovascular:  Antilipid - Statins Failed - 06/11/2024  2:06 PM      Failed - Lipid Panel in normal range within the last 12 months    Cholesterol, Total  Date Value Ref Range Status  10/25/2023 157 100 - 199 mg/dL Final   LDL Chol Calc (NIH)  Date Value Ref Range Status  10/25/2023 75 0 - 99 mg/dL Final   HDL  Date Value Ref Range Status  10/25/2023 53 >39 mg/dL Final   Triglycerides  Date Value Ref Range Status  10/25/2023 169 (H) 0 - 149 mg/dL Final         Passed - Patient is not pregnant      Passed - Valid encounter within last 12 months    Recent Outpatient Visits           1 month ago Candidal intertrigo   [place] [facility] Celestia Rosaline SQUIBB, NP   4 months ago Essential hypertension   [place] [facility] Celestia Rosaline SQUIBB, NP   7 months ago Elevated glucose   [place] [facility] Celestia Rosaline SQUIBB, NP   10 months ago Encounter to establish care    [facility] Celestia Rosaline SQUIBB, NP

## 2024-06-12 ENCOUNTER — Other Ambulatory Visit (INDEPENDENT_AMBULATORY_CARE_PROVIDER_SITE_OTHER): Payer: Self-pay

## 2024-06-15 MED ORDER — HYDROCHLOROTHIAZIDE 12.5 MG PO CAPS: 12.5000 mg | ORAL_CAPSULE | Freq: Every day | ORAL | 3 refills | Status: AC

## 2024-06-26 ENCOUNTER — Ambulatory Visit

## 2024-07-10 ENCOUNTER — Ambulatory Visit (INDEPENDENT_AMBULATORY_CARE_PROVIDER_SITE_OTHER)

## 2024-08-07 ENCOUNTER — Ambulatory Visit (INDEPENDENT_AMBULATORY_CARE_PROVIDER_SITE_OTHER)

## 2024-10-24 ENCOUNTER — Ambulatory Visit (INDEPENDENT_AMBULATORY_CARE_PROVIDER_SITE_OTHER): Admitting: Primary Care
